# Patient Record
Sex: Female | Born: 1985 | Hispanic: Yes | Marital: Married | Smoking: Former smoker
Health system: Southern US, Community
[De-identification: ages and names within clinical notes are randomized; demographics above are authoritative.]

## PROBLEM LIST (undated history)

## (undated) DIAGNOSIS — E559 Vitamin D deficiency, unspecified: Secondary | ICD-10-CM

## (undated) DIAGNOSIS — N83209 Unspecified ovarian cyst, unspecified side: Secondary | ICD-10-CM

## (undated) DIAGNOSIS — E039 Hypothyroidism, unspecified: Secondary | ICD-10-CM

## (undated) HISTORY — DX: Hypothyroidism, unspecified: E03.9

## (undated) HISTORY — DX: Vitamin D deficiency, unspecified: E55.9

## (undated) HISTORY — PX: TUBAL LIGATION: SHX77

---
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
## 2011-05-06 ENCOUNTER — Emergency Department (INDEPENDENT_AMBULATORY_CARE_PROVIDER_SITE_OTHER): Payer: Medicaid Other

## 2011-05-06 ENCOUNTER — Emergency Department: Payer: Self-pay

## 2011-05-06 ENCOUNTER — Encounter: Payer: Self-pay | Admitting: *Deleted

## 2011-05-06 ENCOUNTER — Emergency Department (HOSPITAL_BASED_OUTPATIENT_CLINIC_OR_DEPARTMENT_OTHER): Payer: Medicaid Other

## 2011-05-06 ENCOUNTER — Emergency Department (HOSPITAL_BASED_OUTPATIENT_CLINIC_OR_DEPARTMENT_OTHER)
Admission: EM | Admit: 2011-05-06 | Discharge: 2011-05-06 | Disposition: A | Discharge status: Home / Self Care | Payer: Medicaid Other | Attending: Emergency Medicine | Admitting: Emergency Medicine

## 2011-05-06 DIAGNOSIS — Z3490 Encounter for supervision of normal pregnancy, unspecified, unspecified trimester: Secondary | ICD-10-CM

## 2011-05-06 DIAGNOSIS — O26859 Spotting complicating pregnancy, unspecified trimester: Secondary | ICD-10-CM

## 2011-05-06 DIAGNOSIS — O0990 Supervision of high risk pregnancy, unspecified, unspecified trimester: Secondary | ICD-10-CM

## 2011-05-06 DIAGNOSIS — N949 Unspecified condition associated with female genital organs and menstrual cycle: Secondary | ICD-10-CM

## 2011-05-06 DIAGNOSIS — O4690 Antepartum hemorrhage, unspecified, unspecified trimester: Secondary | ICD-10-CM

## 2011-05-06 HISTORY — DX: Unspecified ovarian cyst, unspecified side: N83.209

## 2011-05-06 LAB — URINALYSIS, ROUTINE W REFLEX MICROSCOPIC
Glucose, UA: NEGATIVE mg/dL
Leukocytes, UA: NEGATIVE
Protein, ur: NEGATIVE mg/dL
pH: 7.5 (ref 5.0–8.0)

## 2011-05-06 LAB — CBC
HCT: 35.6 % — ABNORMAL LOW (ref 36.0–46.0)
Hemoglobin: 12.3 g/dL (ref 12.0–15.0)
MCH: 31.2 pg (ref 26.0–34.0)
MCHC: 34.6 g/dL (ref 30.0–36.0)
RBC: 3.94 MIL/uL (ref 3.87–5.11)

## 2011-05-06 LAB — DIFFERENTIAL
Basophils Relative: 0 % (ref 0–1)
Lymphs Abs: 2.7 10*3/uL (ref 0.7–4.0)
Monocytes Absolute: 0.7 10*3/uL (ref 0.1–1.0)
Monocytes Relative: 6 % (ref 3–12)
Neutro Abs: 8 10*3/uL — ABNORMAL HIGH (ref 1.7–7.7)
Neutrophils Relative %: 68 % (ref 43–77)

## 2011-05-06 LAB — URINE MICROSCOPIC-ADD ON

## 2011-05-06 LAB — WET PREP, GENITAL
Trich, Wet Prep: NONE SEEN
Yeast Wet Prep HPF POC: NONE SEEN

## 2011-05-06 LAB — PREGNANCY, URINE: Preg Test, Ur: POSITIVE

## 2011-05-06 NOTE — ED Notes (Signed)
Pt. Had blood type done.  Call from [place] at [facility] and gave pt. Blood type as A+

## 2011-05-06 NOTE — ED Provider Notes (Signed)
History     CSN: [id number] Arrival date & time: 05/06/2011 10:09 AM   First MD Initiated Contact with Patient 05/06/11 1039      Chief Complaint  Patient presents with  . Vaginal Bleeding    (Consider location/radiation/quality/duration/timing/severity/associated sxs/prior treatment) HPI Patient is a 25 yo [id number] at 12 weeks by LMP of 01/30/2011.  She presents with complaint of vaginal spotting for 1 day with passage of small clots.  She has not passed any POC.  She denies nausea, vomiting, urinary symptoms, or other vaginal discharge.  Patient does complain of mild lower abdominal cramping.  She has noted dyspareunia and states that bleeding began following vaginal discharged.  She has seen blood on the toilet paper but not on her underwear.  She had an episode of spotting last week but thought little of it.  She has an appointment to see her OB on 12/15 but has not yet had an ultrasound.  She denies history of STD's, PID, or abnormal pap smears. Past Medical History  Diagnosis Date  . Ovarian cyst     Past Surgical History  Procedure Date  . Ovarian cyst removal 2002    No family history on file.  History  Substance Use Topics  . Smoking status: Former Games developer  . Smokeless tobacco: Not on file  . Alcohol Use: No    OB History    Grav Para Term Preterm Abortions TAB SAB Ect Mult Living   5 3              Review of Systems  Constitutional: Negative.   HENT: Negative.   Eyes: Negative.   Respiratory: Negative.   Cardiovascular: Negative.   Gastrointestinal: Positive for abdominal pain.  Genitourinary: Positive for vaginal bleeding and dyspareunia.  Musculoskeletal: Negative.   Skin: Negative.   Neurological: Negative.   Hematological: Negative.   Psychiatric/Behavioral: Negative.   All other systems reviewed and are negative.    Allergies  Penicillins  Home Medications   Current Outpatient Rx  Name Route Sig Dispense Refill  . PRENATAL RX 60-1 MG PO TABS  Oral Take 1 tablet by mouth daily.        BP 121/74  Pulse 81  Ht 5\' 4"  (1.626 m)  Wt 242 lb (109.77 kg)  BMI 41.54 kg/m2  SpO2 100%  LMP 01/30/2011  Physical Exam  Nursing note and vitals reviewed. Constitutional: She is oriented to person, place, and time. She appears well-developed and well-nourished. No distress.  HENT:  Head: Normocephalic and atraumatic.  Eyes: Conjunctivae and EOM are normal. Pupils are equal, round, and reactive to light.  Neck: Normal range of motion.  Cardiovascular: Normal rate, regular rhythm, normal heart sounds and intact distal pulses.  Exam reveals no gallop and no friction rub.   No murmur heard. Pulmonary/Chest: Effort normal and breath sounds normal. No respiratory distress. She has no wheezes. She has no rales.  Abdominal: Soft. Bowel sounds are normal. She exhibits no distension. There is no tenderness. There is no rebound and no guarding.  Genitourinary: Pelvic exam was performed with patient supine. Vaginal discharge found.       Brownish mucoid discharge minimal in quantity,  Cervix: external os open internal os closed, discomfort throughout exam but no cervical or adnexal tenderness  Musculoskeletal: Normal range of motion. She exhibits no edema.  Neurological: She is alert and oriented to person, place, and time. No cranial nerve deficit. She exhibits normal muscle tone. Coordination normal.  Skin: Skin is warm  and dry. No rash noted.  Psychiatric: She has a normal mood and affect.    ED Course  Procedures (including critical care time)  Labs Reviewed  CBC - Abnormal; Notable for the following:    WBC 11.7 (*)    HCT 35.6 (*)    All other components within normal limits  DIFFERENTIAL - Abnormal; Notable for the following:    Neutro Abs 8.0 (*)    All other components within normal limits  HCG, QUANTITATIVE, PREGNANCY - Abnormal; Notable for the following:    hCG, Beta Chain, Quant, S 34316 (*)    All other components within normal  limits  URINALYSIS, ROUTINE W REFLEX MICROSCOPIC - Abnormal; Notable for the following:    Hgb urine dipstick MODERATE (*)    All other components within normal limits  WET PREP, GENITAL - Abnormal; Notable for the following:    WBC, Wet Prep HPF POC MODERATE (*)    All other components within normal limits  PREGNANCY, URINE  ABO/RH  URINE MICROSCOPIC-ADD ON  GC/CHLAMYDIA PROBE AMP, GENITAL   US Ob Comp Less 14 Wks  05/06/2011  *RADIOLOGY REPORT*  Clinical Data: Pelvic pain with vaginal spotting.  Post left ovarian cystectomy.  OBSTETRIC <14 WK ULTRASOUND  Technique:  Transabdominal ultrasound was performed for evaluation of the gestation as well as the maternal uterus and adnexal regions.  Comparison:  None.  Intrauterine gestational sac: Visualized/normal in shape. Yolk sac: Not visualized Embryo: Visualized Cardiac Activity: Visualized Heart Rate: 147 bpm  MSD:  mm  w  d CRL:  75.9 mm mm  13w  5d           [place] EDC: 11/06/2011  Maternal uterus/Adnexae: The left ovary has a normal appearance measuring 2.9 x 2.9 x 1.6 cm.  The right ovary was not seen with confidence. No pelvic fluid is seen.  IMPRESSION: Single living intrauterine pregnancy demonstrating an estimated gestational age by crown-rump length of 13 weeks 5 days.  This corresponds well with expected estimated gestational age by LMP of 13 weeks 5 days with corresponding EDC of 11/06/2011.  Normal ovaries.  Original Report Authenticated By: Bertha Stakes, M.D.     1. Vaginal bleeding in pregnancy   2. Normal IUP (intrauterine pregnancy) on prenatal ultrasound       MDM  Patient had history and physical concerning for possible fetal loss.  The patient had samples collected.  UA was unremarkable and U preg was positive.  Patient had unremarkable wet prep and UA.  WBCs were present but exam was not consistent with cervicitis.  Patient had bHCG of 34,000.  Os was closed and TVUS showed IUP with normal [redacted] week gestation fetus.   Patient had follow-up already established with her OB-GYN.  She was given bleeding precautions and reasons to return.  Rh type was + and rhoGham was not indicated.  The patient was discharged in good condition with threatened Ab.  No report was called to Ob as patient is only [redacted] weeks gestation and she has not yet established care with her OB.  She was discharged in good condition.        Cyndra Numbers, MD 05/06/11 2109

## 2011-05-06 NOTE — ED Notes (Signed)
Patient states she spotting vaginal bleeding last week and it resolved on its own.  Last after voiding she wiped and had mucus / blood on the tissue.  C/o lower abdominal pain for the last two days.  Gravada 4 para 3.

## 2011-05-07 LAB — GC/CHLAMYDIA PROBE AMP, GENITAL
Chlamydia, DNA Probe: NEGATIVE
GC Probe Amp, Genital: NEGATIVE

## 2013-05-09 ENCOUNTER — Emergency Department (HOSPITAL_BASED_OUTPATIENT_CLINIC_OR_DEPARTMENT_OTHER): Payer: Managed Care, Other (non HMO)

## 2013-05-09 ENCOUNTER — Encounter (HOSPITAL_BASED_OUTPATIENT_CLINIC_OR_DEPARTMENT_OTHER): Payer: Self-pay | Admitting: Emergency Medicine

## 2013-05-09 ENCOUNTER — Emergency Department (HOSPITAL_BASED_OUTPATIENT_CLINIC_OR_DEPARTMENT_OTHER)
Admission: EM | Admit: 2013-05-09 | Discharge: 2013-05-09 | Disposition: A | Discharge status: Home / Self Care | Payer: Managed Care, Other (non HMO) | Attending: Emergency Medicine | Admitting: Emergency Medicine

## 2013-05-09 DIAGNOSIS — Z3202 Encounter for pregnancy test, result negative: Secondary | ICD-10-CM | POA: Insufficient documentation

## 2013-05-09 DIAGNOSIS — Z880 Allergy status to penicillin: Secondary | ICD-10-CM | POA: Insufficient documentation

## 2013-05-09 DIAGNOSIS — R1031 Right lower quadrant pain: Secondary | ICD-10-CM | POA: Insufficient documentation

## 2013-05-09 DIAGNOSIS — Z8742 Personal history of other diseases of the female genital tract: Secondary | ICD-10-CM | POA: Insufficient documentation

## 2013-05-09 DIAGNOSIS — R110 Nausea: Secondary | ICD-10-CM | POA: Insufficient documentation

## 2013-05-09 DIAGNOSIS — Z87891 Personal history of nicotine dependence: Secondary | ICD-10-CM | POA: Insufficient documentation

## 2013-05-09 DIAGNOSIS — R109 Unspecified abdominal pain: Secondary | ICD-10-CM

## 2013-05-09 LAB — CBC WITH DIFFERENTIAL/PLATELET
Eosinophils Absolute: 0.2 10*3/uL (ref 0.0–0.7)
HCT: 36.8 % (ref 36.0–46.0)
Hemoglobin: 12.4 g/dL (ref 12.0–15.0)
Lymphs Abs: 3 10*3/uL (ref 0.7–4.0)
MCH: 31.2 pg (ref 26.0–34.0)
Monocytes Absolute: 0.8 10*3/uL (ref 0.1–1.0)
Monocytes Relative: 8 % (ref 3–12)
Neutrophils Relative %: 61 % (ref 43–77)
RBC: 3.98 MIL/uL (ref 3.87–5.11)

## 2013-05-09 LAB — URINALYSIS, ROUTINE W REFLEX MICROSCOPIC
Leukocytes, UA: NEGATIVE
Nitrite: NEGATIVE
Protein, ur: NEGATIVE mg/dL
Specific Gravity, Urine: 1.011 (ref 1.005–1.030)
Urobilinogen, UA: 0.2 mg/dL (ref 0.0–1.0)

## 2013-05-09 LAB — COMPREHENSIVE METABOLIC PANEL
ALT: 30 U/L (ref 0–35)
AST: 19 U/L (ref 0–37)
Albumin: 4 g/dL (ref 3.5–5.2)
Alkaline Phosphatase: 80 U/L (ref 39–117)
BUN: 10 mg/dL (ref 6–23)
CO2: 24 mEq/L (ref 19–32)
Calcium: 9.1 mg/dL (ref 8.4–10.5)
Chloride: 101 mEq/L (ref 96–112)
Creatinine, Ser: 0.6 mg/dL (ref 0.50–1.10)
GFR calc Af Amer: >90 mL/min (ref >90)
GFR calc non Af Amer: >90 mL/min (ref >90)
Glucose, Bld: 90 mg/dL (ref 70–99)
Potassium: 3.9 mEq/L (ref 3.5–5.1)
Sodium: 137 mEq/L (ref 135–145)
Total Bilirubin: 0.2 mg/dL — ABNORMAL LOW (ref 0.3–1.2)
Total Protein: 8.6 g/dL — ABNORMAL HIGH (ref 6.0–8.3)

## 2013-05-09 LAB — WET PREP, GENITAL
Trich, Wet Prep: NONE SEEN
Yeast Wet Prep HPF POC: NONE SEEN

## 2013-05-09 LAB — URINE MICROSCOPIC-ADD ON

## 2013-05-09 LAB — PREGNANCY, URINE: Preg Test, Ur: NEGATIVE

## 2013-05-09 MED ORDER — IOHEXOL 300 MG/ML  SOLN
50.0000 mL | Freq: Once | INTRAMUSCULAR | Status: AC | PRN
  Administered 2013-05-09: 50 mL via ORAL

## 2013-05-09 MED ORDER — HYDROCODONE-ACETAMINOPHEN 5-325 MG PO TABS: 2.0000 | ORAL_TABLET | ORAL | Status: DC | PRN

## 2013-05-09 MED ORDER — SODIUM CHLORIDE 0.9 % IV BOLUS (SEPSIS)
1000.0000 mL | Freq: Once | INTRAVENOUS | Status: AC
  Administered 2013-05-09: 1000 mL via INTRAVENOUS

## 2013-05-09 MED ORDER — DIPHENHYDRAMINE HCL 50 MG/ML IJ SOLN
25.0000 mg | Freq: Once | INTRAMUSCULAR | Status: AC
  Administered 2013-05-09: 25 mg via INTRAVENOUS

## 2013-05-09 MED ORDER — HYDROMORPHONE HCL PF 1 MG/ML IJ SOLN
1.0000 mg | Freq: Once | INTRAMUSCULAR | Status: AC
  Administered 2013-05-09: 1 mg via INTRAVENOUS
  Filled 2013-05-09: qty 1

## 2013-05-09 MED ORDER — METHYLPREDNISOLONE SODIUM SUCC 125 MG IJ SOLR
125.0000 mg | Freq: Once | INTRAMUSCULAR | Status: AC
  Administered 2013-05-09: 125 mg via INTRAVENOUS
  Filled 2013-05-09: qty 2

## 2013-05-09 MED ORDER — DIPHENHYDRAMINE HCL 50 MG/ML IJ SOLN
INTRAMUSCULAR | Status: AC
  Administered 2013-05-09: 25 mg via INTRAVENOUS
  Filled 2013-05-09: qty 1

## 2013-05-09 MED ORDER — ONDANSETRON HCL 4 MG/2ML IJ SOLN
4.0000 mg | Freq: Once | INTRAMUSCULAR | Status: AC
  Administered 2013-05-09: 4 mg via INTRAVENOUS

## 2013-05-09 MED ORDER — ONDANSETRON HCL 4 MG/2ML IJ SOLN
4.0000 mg | Freq: Once | INTRAMUSCULAR | Status: DC
  Filled 2013-05-09: qty 2

## 2013-05-09 MED ORDER — IOHEXOL 300 MG/ML  SOLN
100.0000 mL | Freq: Once | INTRAMUSCULAR | Status: AC | PRN
  Administered 2013-05-09: 100 mL via INTRAVENOUS

## 2013-05-09 MED ORDER — HYDROMORPHONE HCL PF 1 MG/ML IJ SOLN
1.0000 mg | Freq: Once | INTRAMUSCULAR | Status: AC
  Administered 2013-05-09: 0.5 mg via INTRAVENOUS
  Filled 2013-05-09 (×2): qty 1

## 2013-05-09 MED ORDER — METRONIDAZOLE 500 MG PO TABS: 500.0000 mg | ORAL_TABLET | Freq: Two times a day (BID) | ORAL | Status: DC

## 2013-05-09 MED ORDER — FAMOTIDINE IN NACL 20-0.9 MG/50ML-% IV SOLN
20.0000 mg | Freq: Once | INTRAVENOUS | Status: AC
  Administered 2013-05-09: 20 mg via INTRAVENOUS
  Filled 2013-05-09: qty 50

## 2013-05-09 NOTE — ED Provider Notes (Signed)
CSN: [id number]     Arrival date & time 05/09/13  1519 History   First MD Initiated Contact with Patient 05/09/13 1548     Chief Complaint  Patient presents with  . Abdominal Pain   (Consider location/radiation/quality/duration/timing/severity/associated sxs/prior Treatment) Patient is a 27 y.o. female presenting with abdominal pain. The history is provided by the patient.  Abdominal Pain Pain location:  RLQ Pain quality: aching, sharp and stabbing   Pain radiates to:  RLQ Pain severity:  Moderate Onset quality:  Sudden Duration:  4 hours Timing:  Constant Progression:  Unchanged Chronicity:  New Context: alcohol use and sick contacts   Relieved by:  Nothing Worsened by:  Nothing tried Ineffective treatments:  None tried Associated symptoms: nausea   Associated symptoms: no vomiting   Risk factors: not pregnant and no recent hospitalization     Past Medical History  Diagnosis Date  . Ovarian cyst    Past Surgical History  Procedure Laterality Date  . Ovarian cyst removal  2002  . Tubal ligation     History reviewed. No pertinent family history. History  Substance Use Topics  . Smoking status: Former Games developer  . Smokeless tobacco: Not on file  . Alcohol Use: No   OB History   Grav Para Term Preterm Abortions TAB SAB Ect Mult Living   5 3             Review of Systems  Gastrointestinal: Positive for nausea and abdominal pain. Negative for vomiting.  All other systems reviewed and are negative.    Allergies  Penicillins  Home Medications  No current outpatient prescriptions on file. BP 109/72  Pulse 76  Temp(Src) 97.9 F (36.6 C) (Oral)  Resp 18  Ht 5\' 3"  (1.6 m)  Wt 229 lb (103.874 kg)  BMI 40.58 kg/m2  SpO2 100%  LMP 03/31/2013  Breastfeeding? Unknown Physical Exam  Nursing note and vitals reviewed. Constitutional: She is oriented to person, place, and time. She appears well-developed and well-nourished.  HENT:  Head: Normocephalic and  atraumatic.  Eyes: Pupils are equal, round, and reactive to light.  Neck: Normal range of motion.  Cardiovascular: Normal rate and normal heart sounds.   Pulmonary/Chest: Effort normal and breath sounds normal.  Abdominal: Soft. There is tenderness.  Genitourinary: Uterus normal.  Small amount of blood  Musculoskeletal: Normal range of motion.  Neurological: She is alert and oriented to person, place, and time.  Skin: Skin is warm.  Psychiatric: She has a normal mood and affect.    ED Course  Procedures (including critical care time) Labs Review Labs Reviewed  WET PREP, GENITAL - Abnormal; Notable for the following:    Clue Cells Wet Prep HPF POC FEW (*)    WBC, Wet Prep HPF POC FEW (*)    All other components within normal limits  URINALYSIS, ROUTINE W REFLEX MICROSCOPIC - Abnormal; Notable for the following:    Hgb urine dipstick MODERATE (*)    All other components within normal limits  URINE MICROSCOPIC-ADD ON - Abnormal; Notable for the following:    Squamous Epithelial / LPF FEW (*)    Bacteria, UA FEW (*)    All other components within normal limits  COMPREHENSIVE METABOLIC PANEL - Abnormal; Notable for the following:    Total Protein 8.6 (*)    Total Bilirubin 0.2 (*)    All other components within normal limits  GC/CHLAMYDIA PROBE AMP  PREGNANCY, URINE  CBC WITH DIFFERENTIAL   Imaging Review US Transvaginal  Non-ob  05/09/2013   CLINICAL DATA:  Right lower quadrant abdominal pain. Irregular menstrual periods.  EXAM: TRANSABDOMINAL AND TRANSVAGINAL ULTRASOUND OF PELVIS  TECHNIQUE: Both transabdominal and transvaginal ultrasound examinations of the pelvis were performed. Transabdominal technique was performed for global imaging of the pelvis including uterus, ovaries, adnexal regions, and pelvic cul-de-sac. It was necessary to proceed with endovaginal exam following the transabdominal exam to visualize the uterus, ovaries, and endometrium. .  COMPARISON:  05/09/2013   FINDINGS: Uterus  Measurements: 8.7 x 4.5 x 6.2 cm. Heterogeneous echotexture without focal mass.  Endometrium  Thickness: 12 mm.  No focal abnormality visualized.  Right ovary  Measurements:  Not visualized.  Left ovary  Measurements: 3.8 x 2.0 x 3.7 cm. Normal appearance/no adnexal mass.  Other findings  No free fluid.  IMPRESSION: 1. Mildly heterogeneous myometrium, query adenomyosis. 2. Nonvisualization of the right ovary.   Electronically Signed   By: Herbie Baltimore M.D.   On: 05/09/2013 20:15   US Pelvis Complete  05/09/2013   CLINICAL DATA:  Right lower quadrant abdominal pain. Irregular menstrual periods.  EXAM: TRANSABDOMINAL AND TRANSVAGINAL ULTRASOUND OF PELVIS  TECHNIQUE: Both transabdominal and transvaginal ultrasound examinations of the pelvis were performed. Transabdominal technique was performed for global imaging of the pelvis including uterus, ovaries, adnexal regions, and pelvic cul-de-sac. It was necessary to proceed with endovaginal exam following the transabdominal exam to visualize the uterus, ovaries, and endometrium. .  COMPARISON:  05/09/2013  FINDINGS: Uterus  Measurements: 8.7 x 4.5 x 6.2 cm. Heterogeneous echotexture without focal mass.  Endometrium  Thickness: 12 mm.  No focal abnormality visualized.  Right ovary  Measurements:  Not visualized.  Left ovary  Measurements: 3.8 x 2.0 x 3.7 cm. Normal appearance/no adnexal mass.  Other findings  No free fluid.  IMPRESSION: 1. Mildly heterogeneous myometrium, query adenomyosis. 2. Nonvisualization of the right ovary.   Electronically Signed   By: Herbie Baltimore M.D.   On: 05/09/2013 20:15   Ct Abdomen Pelvis W Contrast  05/09/2013   CLINICAL DATA:  Lower abdominal pain  EXAM: CT ABDOMEN AND PELVIS WITH CONTRAST  TECHNIQUE: Multidetector CT imaging of the abdomen and pelvis was performed using the standard protocol following bolus administration of intravenous contrast. Oral contrast was also administered.  CONTRAST:   OMNIPAQUE IOHEXOL 300 MG/ML  SOLN  COMPARISON:  None.  FINDINGS: Lung bases are clear.  The liver is prominent, measuring 17.7 cm in length. No focal liver lesions are identified. There is no biliary duct dilatation. The gallbladder wall does not appear appreciably thickened.  Spleen, pancreas, and adrenals appear normal. Kidneys bilaterally show no mass or hydronephrosis on either side.  In the pelvis, there is no mass or fluid collection. Appendix region appears normal; there is no right lower quadrant inflammatory type change.  There is no bowel obstruction.  No free air or portal venous air.  There is no ascites, adenopathy, or abscess in the abdomen or pelvis. Aorta is nonaneurysmal. There are no blastic or lytic bone lesions.  IMPRESSION: No mesenteric inflammation or abscess. Appendix region appears normal. No bowel obstruction. No hydronephrosis.  Liver is prominent but otherwise appears normal.   Electronically Signed   By: Bretta Bang M.D.   On: 05/09/2013 17:31    EKG Interpretation   None       MDM Pt has pain right lower quadrant.   Pt reports some relief with dilaudid.   Ct normal.   Pt felt short of breath after  IV contrast.  Pt reports throat felt tight.   Pt given solumedrol, benadryl and pepcid.   Pt reports symptoms resolved.   Ultrasound is normal,  No ovarian cyst.   Wet prep does show  Bv.   Pt given rx for hydrocodone and flagyl.  Pt advised to recheck with her MD.    1. Abdominal pain      Elson Areas, [place] 05/09/13 2049

## 2013-05-09 NOTE — ED Notes (Signed)
Pt returns from ct scan, reports she feels as if her throat is "scratchy and full" karen sofia, pa alerted to bedside.

## 2013-05-09 NOTE — ED Notes (Signed)
Pt resting quietly, reports decreased "scratchy and fullness" in her throat.

## 2013-05-09 NOTE — ED Notes (Signed)
Pt c/o right lower abd pain x 4 hrs  

## 2013-05-09 NOTE — ED Provider Notes (Signed)
  Medical screening examination/treatment/procedure(s) were performed by non-physician practitioner and as supervising physician I was immediately available for consultation/collaboration.  EKG Interpretation   None          Persephone Schriever, MD 05/09/13 2203 

## 2013-05-09 NOTE — ED Notes (Signed)
Patient transported to Ultrasound 

## 2013-11-27 ENCOUNTER — Encounter (HOSPITAL_BASED_OUTPATIENT_CLINIC_OR_DEPARTMENT_OTHER): Payer: Self-pay | Admitting: Emergency Medicine

## 2013-11-27 ENCOUNTER — Emergency Department (HOSPITAL_BASED_OUTPATIENT_CLINIC_OR_DEPARTMENT_OTHER)
Admission: EM | Admit: 2013-11-27 | Discharge: 2013-11-27 | Disposition: A | Discharge status: Home / Self Care | Payer: Managed Care, Other (non HMO) | Attending: Emergency Medicine | Admitting: Emergency Medicine

## 2013-11-27 ENCOUNTER — Emergency Department (HOSPITAL_BASED_OUTPATIENT_CLINIC_OR_DEPARTMENT_OTHER): Payer: Managed Care, Other (non HMO)

## 2013-11-27 DIAGNOSIS — Z79899 Other long term (current) drug therapy: Secondary | ICD-10-CM | POA: Insufficient documentation

## 2013-11-27 DIAGNOSIS — Z8742 Personal history of other diseases of the female genital tract: Secondary | ICD-10-CM | POA: Insufficient documentation

## 2013-11-27 DIAGNOSIS — R350 Frequency of micturition: Secondary | ICD-10-CM | POA: Insufficient documentation

## 2013-11-27 DIAGNOSIS — R5383 Other fatigue: Secondary | ICD-10-CM

## 2013-11-27 DIAGNOSIS — Z880 Allergy status to penicillin: Secondary | ICD-10-CM | POA: Insufficient documentation

## 2013-11-27 DIAGNOSIS — R1031 Right lower quadrant pain: Secondary | ICD-10-CM | POA: Insufficient documentation

## 2013-11-27 DIAGNOSIS — Z87891 Personal history of nicotine dependence: Secondary | ICD-10-CM | POA: Insufficient documentation

## 2013-11-27 DIAGNOSIS — R110 Nausea: Secondary | ICD-10-CM | POA: Insufficient documentation

## 2013-11-27 DIAGNOSIS — R5381 Other malaise: Secondary | ICD-10-CM | POA: Insufficient documentation

## 2013-11-27 DIAGNOSIS — Z9851 Tubal ligation status: Secondary | ICD-10-CM | POA: Insufficient documentation

## 2013-11-27 DIAGNOSIS — Z3202 Encounter for pregnancy test, result negative: Secondary | ICD-10-CM | POA: Insufficient documentation

## 2013-11-27 DIAGNOSIS — R509 Fever, unspecified: Secondary | ICD-10-CM | POA: Insufficient documentation

## 2013-11-27 LAB — COMPREHENSIVE METABOLIC PANEL
ALK PHOS: 98 U/L (ref 39–117)
ALT: 73 U/L — ABNORMAL HIGH (ref 0–35)
AST: 58 U/L — ABNORMAL HIGH (ref 0–37)
Albumin: 4.2 g/dL (ref 3.5–5.2)
BUN: 15 mg/dL (ref 6–23)
CO2: 24 mEq/L (ref 19–32)
Calcium: 9.4 mg/dL (ref 8.4–10.5)
Chloride: 101 mEq/L (ref 96–112)
Creatinine, Ser: 0.7 mg/dL (ref 0.50–1.10)
GLUCOSE: 91 mg/dL (ref 70–99)
POTASSIUM: 3.9 meq/L (ref 3.7–5.3)
Sodium: 139 mEq/L (ref 137–147)
Total Bilirubin: 0.3 mg/dL (ref 0.3–1.2)
Total Protein: 9.2 g/dL — ABNORMAL HIGH (ref 6.0–8.3)

## 2013-11-27 LAB — CBC WITH DIFFERENTIAL/PLATELET
Basophils Absolute: 0 10*3/uL (ref 0.0–0.1)
Basophils Relative: 0 % (ref 0–1)
Eosinophils Absolute: 0.3 10*3/uL (ref 0.0–0.7)
Eosinophils Relative: 3 % (ref 0–5)
HCT: 39.5 % (ref 36.0–46.0)
HEMOGLOBIN: 13.4 g/dL (ref 12.0–15.0)
LYMPHS ABS: 3.5 10*3/uL (ref 0.7–4.0)
Lymphocytes Relative: 35 % (ref 12–46)
MCH: 30.9 pg (ref 26.0–34.0)
MCHC: 33.9 g/dL (ref 30.0–36.0)
MCV: 91 fL (ref 78.0–100.0)
Monocytes Absolute: 0.9 10*3/uL (ref 0.1–1.0)
Monocytes Relative: 9 % (ref 3–12)
NEUTROS ABS: 5.3 10*3/uL (ref 1.7–7.7)
Neutrophils Relative %: 53 % (ref 43–77)
Platelets: 284 10*3/uL (ref 150–400)
RBC: 4.34 MIL/uL (ref 3.87–5.11)
RDW: 13.4 % (ref 11.5–15.5)
WBC: 9.9 10*3/uL (ref 4.0–10.5)

## 2013-11-27 LAB — URINALYSIS, ROUTINE W REFLEX MICROSCOPIC
BILIRUBIN URINE: NEGATIVE
Glucose, UA: NEGATIVE mg/dL
KETONES UR: NEGATIVE mg/dL
Leukocytes, UA: NEGATIVE
NITRITE: NEGATIVE
PH: 5.5 (ref 5.0–8.0)
PROTEIN: NEGATIVE mg/dL
Specific Gravity, Urine: 1.017 (ref 1.005–1.030)
Urobilinogen, UA: 0.2 mg/dL (ref 0.0–1.0)

## 2013-11-27 LAB — PREGNANCY, URINE: Preg Test, Ur: NEGATIVE

## 2013-11-27 LAB — LIPASE, BLOOD: Lipase: 56 U/L (ref 11–59)

## 2013-11-27 LAB — URINE MICROSCOPIC-ADD ON

## 2013-11-27 MED ORDER — IOHEXOL 300 MG/ML  SOLN
50.0000 mL | Freq: Once | INTRAMUSCULAR | Status: AC | PRN
  Administered 2013-11-27: 50 mL via ORAL

## 2013-11-27 MED ORDER — HYDROCODONE-ACETAMINOPHEN 5-325 MG PO TABS: 2.0000 | ORAL_TABLET | ORAL | Status: DC | PRN

## 2013-11-27 MED ORDER — IOHEXOL 300 MG/ML  SOLN
100.0000 mL | Freq: Once | INTRAMUSCULAR | Status: AC | PRN
  Administered 2013-11-27: 100 mL via INTRAVENOUS

## 2013-11-27 MED ORDER — ONDANSETRON HCL 4 MG/2ML IJ SOLN
4.0000 mg | Freq: Once | INTRAMUSCULAR | Status: AC
  Administered 2013-11-27: 4 mg via INTRAVENOUS

## 2013-11-27 MED ORDER — IBUPROFEN 800 MG PO TABS: 800.0000 mg | ORAL_TABLET | Freq: Three times a day (TID) | ORAL | Status: DC

## 2013-11-27 MED ORDER — SODIUM CHLORIDE 0.9 % IV BOLUS (SEPSIS)
1000.0000 mL | Freq: Once | INTRAVENOUS | Status: AC
  Administered 2013-11-27: 1000 mL via INTRAVENOUS

## 2013-11-27 MED ORDER — ONDANSETRON HCL 4 MG/2ML IJ SOLN
4.0000 mg | Freq: Once | INTRAMUSCULAR | Status: DC
  Filled 2013-11-27: qty 2

## 2013-11-27 NOTE — ED Provider Notes (Signed)
CSN: [id number]     Arrival date & time 11/27/13  1910 History   First MD Initiated Contact with Patient 11/27/13 2057     This chart was scribed for Breanna English, * by Arlan OrganAshley Leger, ED Scribe. This patient was seen in room MH08/MH08 and the patient's care was started 9:15 PM.   Chief Complaint  Patient presents with  . Abdominal Pain   The history is provided by the patient. No language interpreter was used.    HPI Comments: Breanna English is a 28 y.o. female who presents to the Emergency Department complaining of constant, moderate R sided abdominal pain that radiates to the R flank x 2 days noted directly after exercise. She describes this pain as sharp and pressure. She also reports fatigue, fever, urinary frequency, and nausea. This pain is exacerbated with movement. No alleviating factors at this time. She has tried OTC Ibuprofen with mild temporary improvement. She denies any vomiting or diarrhea. No vaginal discharge. Pt has no pertinent past medical history. No other concerns this visit.  Past Medical History  Diagnosis Date  . Ovarian cyst    Past Surgical History  Procedure Laterality Date  . Ovarian cyst removal  2002  . Tubal ligation     No family history on file. History  Substance Use Topics  . Smoking status: Former Games developermoker  . Smokeless tobacco: Not on file  . Alcohol Use: No   OB History   Grav Para Term Preterm Abortions TAB SAB Ect Mult Living   5 3             Review of Systems  Constitutional: Positive for fever and fatigue.  Gastrointestinal: Positive for nausea and abdominal pain. Negative for vomiting and diarrhea.  Genitourinary: Positive for frequency and flank pain (R flank). Negative for vaginal discharge.  All other systems reviewed and are negative.     Allergies  Penicillins  Home Medications   Prior to Admission medications   Medication Sig Start Date End Date Taking? Authorizing Reynalda Canny  HYDROcodone-acetaminophen  (NORCO/VICODIN) 5-325 MG per tablet Take 2 tablets by mouth every 4 (four) hours as needed. 05/09/13   Elson AreasLeslie K Sofia, PA-C  metroNIDAZOLE (FLAGYL) 500 MG tablet Take 1 tablet (500 mg total) by mouth 2 (two) times daily. 05/09/13   Elson AreasLeslie K Sofia, PA-C   Triage Vitals: BP 130/82  Pulse 64  Temp(Src) 99.1 F (37.3 C) (Oral)  Resp 16  Ht 5\' 3"  (1.6 m)  Wt 247 lb (112.038 kg)  BMI 43.76 kg/m2  SpO2 100%   Physical Exam  Constitutional: She is oriented to person, place, and time. She appears well-developed and well-nourished. No distress.  HENT:  Head: Normocephalic and atraumatic.  Right Ear: Hearing normal.  Left Ear: Hearing normal.  Nose: Nose normal.  Mouth/Throat: Oropharynx is clear and moist and mucous membranes are normal.  Eyes: Conjunctivae and EOM are normal. Pupils are equal, round, and reactive to light.  Neck: Normal range of motion. Neck supple.  Cardiovascular: Regular rhythm, S1 normal and S2 normal.  Exam reveals no gallop and no friction rub.   No murmur heard. Pulmonary/Chest: Effort normal and breath sounds normal. No respiratory distress. She exhibits no tenderness.  Abdominal: Soft. Normal appearance and bowel sounds are normal. There is no hepatosplenomegaly. There is tenderness. There is no rebound, no guarding, no tenderness at McBurney's point and negative Murphy's sign. No hernia.  RLQ tenderness  Musculoskeletal: Normal range of motion.  Neurological: She is alert  and oriented to person, place, and time. She has normal strength. No cranial nerve deficit or sensory deficit. Coordination normal. GCS eye subscore is 4. GCS verbal subscore is 5. GCS motor subscore is 6.  Skin: Skin is warm, dry and intact. No rash noted. No cyanosis.  Psychiatric: She has a normal mood and affect. Her speech is normal and behavior is normal. Thought content normal.    ED Course  Procedures (including critical care time)  DIAGNOSTIC STUDIES: Oxygen Saturation is 100% on RA,  Normal by my interpretation.    COORDINATION OF CARE: 9:00 PM-Discussed treatment plan with pt at bedside and pt agreed to plan.     Labs Review Labs Reviewed  URINALYSIS, ROUTINE W REFLEX MICROSCOPIC - Abnormal; Notable for the following:    Hgb urine dipstick TRACE (*)    All other components within normal limits  PREGNANCY, URINE  URINE MICROSCOPIC-ADD ON    Imaging Review No results found.   EKG Interpretation None      MDM   Final diagnoses:  None   abdominal pain  Presents to ER for evaluation of abdominal pain. Symptoms began 2 days ago. She has not had any vaginal discharge or pelvic complaints. Pain was in the right side of the abdomen diffusely, including the right lower quadrant. Appendicitis was considered. CT scan was performed, however, and is negative. Lab work was normal. Etiology unclear, will treat with analgesia, followup with primary doctor.  I personally performed the services described in this documentation, which was scribed in my presence. The recorded information has been reviewed and is accurate.    Breanna Creasehristopher J. Pollina, MD 11/27/13 [phone]

## 2013-11-27 NOTE — ED Notes (Addendum)
Abd pain x 2 days-denies n/v/d,urinary s/s or vaginal d/c

## 2013-11-27 NOTE — Discharge Instructions (Signed)
Abdominal Pain, Women °Abdominal (stomach, pelvic, or belly) pain can be caused by many things. It is important to tell your doctor: °· The location of the pain. °· Does it come and go or is it present all the time? °· Are there things that start the pain (eating certain foods, exercise)? °· Are there other symptoms associated with the pain (fever, nausea, vomiting, diarrhea)? °All of this is helpful to know when trying to find the cause of the pain. °CAUSES  °· Stomach: virus or bacteria infection, or ulcer. °· Intestine: appendicitis (inflamed appendix), regional ileitis (Crohn's disease), ulcerative colitis (inflamed colon), irritable bowel syndrome, diverticulitis (inflamed diverticulum of the colon), or cancer of the stomach or intestine. °· Gallbladder disease or stones in the gallbladder. °· Kidney disease, kidney stones, or infection. °· Pancreas infection or cancer. °· Fibromyalgia (pain disorder). °· Diseases of the female organs: °¨ Uterus: fibroid (non-cancerous) tumors or infection. °¨ Fallopian tubes: infection or tubal pregnancy. °¨ Ovary: cysts or tumors. °¨ Pelvic adhesions (scar tissue). °¨ Endometriosis (uterus lining tissue growing in the pelvis and on the pelvic organs). °¨ Pelvic congestion syndrome (female organs filling up with blood just before the menstrual period). °¨ Pain with the menstrual period. °¨ Pain with ovulation (producing an egg). °¨ Pain with an IUD (intrauterine device, birth control) in the uterus. °¨ Cancer of the female organs. °· Functional pain (pain not caused by a disease, may improve without treatment). °· Psychological pain. °· Depression. °DIAGNOSIS  °Your doctor will decide the seriousness of your pain by doing an examination. °· Blood tests. °· X-rays. °· Ultrasound. °· CT scan (computed tomography, special type of X-ray). °· MRI (magnetic resonance imaging). °· Cultures, for infection. °· Barium enema (dye inserted in the large intestine, to better view it with  X-rays). °· Colonoscopy (looking in intestine with a lighted tube). °· Laparoscopy (minor surgery, looking in abdomen with a lighted tube). °· Major abdominal exploratory surgery (looking in abdomen with a large incision). °TREATMENT  °The treatment will depend on the cause of the pain.  °· Many cases can be observed and treated at home. °· Over-the-counter medicines recommended by your caregiver. °· Prescription medicine. °· Antibiotics, for infection. °· Birth control pills, for painful periods or for ovulation pain. °· Hormone treatment, for endometriosis. °· Nerve blocking injections. °· Physical therapy. °· Antidepressants. °· Counseling with a psychologist or psychiatrist. °· Minor or major surgery. °HOME CARE INSTRUCTIONS  °· Do not take laxatives, unless directed by your caregiver. °· Take over-the-counter pain medicine only if ordered by your caregiver. Do not take aspirin because it can cause an upset stomach or bleeding. °· Try a clear liquid diet (broth or water) as ordered by your caregiver. Slowly move to a bland diet, as tolerated, if the pain is related to the stomach or intestine. °· Have a thermometer and take your temperature several times a day, and record it. °· Bed rest and sleep, if it helps the pain. °· Avoid sexual intercourse, if it causes pain. °· Avoid stressful situations. °· Keep your follow-up appointments and tests, as your caregiver orders. °· If the pain does not go away with medicine or surgery, you may try: °¨ Acupuncture. °¨ Relaxation exercises (yoga, meditation). °¨ Group therapy. °¨ Counseling. °SEEK MEDICAL CARE IF:  °· You notice certain foods cause stomach pain. °· Your home care treatment is not helping your pain. °· You need stronger pain medicine. °· You want your IUD removed. °· You feel faint or   lightheaded. °· You develop nausea and vomiting. °· You develop a rash. °· You are having side effects or an allergy to your medicine. °SEEK IMMEDIATE MEDICAL CARE IF:  °· Your  pain does not go away or gets worse. °· You have a fever. °· Your pain is felt only in portions of the abdomen. The right side could possibly be appendicitis. The left lower portion of the abdomen could be colitis or diverticulitis. °· You are passing blood in your stools (bright red or black tarry stools, with or without vomiting). °· You have blood in your urine. °· You develop chills, with or without a fever. °· You pass out. °MAKE SURE YOU:  °· Understand these instructions. °· Will watch your condition. °· Will get help right away if you are not doing well or get worse. °Document Released: 03/14/2007 Document Revised: 08/09/2011 Document Reviewed: 04/03/2009 °[employer]® Patient Information ©2015 [employer]. This information is not intended to replace advice given to you by your health care provider. Make sure you discuss any questions you have with your health care provider. ° °

## 2014-04-01 ENCOUNTER — Encounter (HOSPITAL_BASED_OUTPATIENT_CLINIC_OR_DEPARTMENT_OTHER): Payer: Self-pay | Admitting: Emergency Medicine

## 2014-04-05 ENCOUNTER — Emergency Department (HOSPITAL_BASED_OUTPATIENT_CLINIC_OR_DEPARTMENT_OTHER)
Admission: EM | Admit: 2014-04-05 | Discharge: 2014-04-05 | Disposition: A | Discharge status: Home / Self Care | Payer: Managed Care, Other (non HMO) | Attending: Emergency Medicine | Admitting: Emergency Medicine

## 2014-04-05 ENCOUNTER — Encounter (HOSPITAL_BASED_OUTPATIENT_CLINIC_OR_DEPARTMENT_OTHER): Payer: Self-pay | Admitting: *Deleted

## 2014-04-05 DIAGNOSIS — R21 Rash and other nonspecific skin eruption: Secondary | ICD-10-CM | POA: Diagnosis not present

## 2014-04-05 DIAGNOSIS — Z79899 Other long term (current) drug therapy: Secondary | ICD-10-CM | POA: Insufficient documentation

## 2014-04-05 DIAGNOSIS — Z8742 Personal history of other diseases of the female genital tract: Secondary | ICD-10-CM | POA: Diagnosis not present

## 2014-04-05 DIAGNOSIS — Z87891 Personal history of nicotine dependence: Secondary | ICD-10-CM | POA: Insufficient documentation

## 2014-04-05 DIAGNOSIS — Z792 Long term (current) use of antibiotics: Secondary | ICD-10-CM | POA: Insufficient documentation

## 2014-04-05 DIAGNOSIS — Z791 Long term (current) use of non-steroidal anti-inflammatories (NSAID): Secondary | ICD-10-CM | POA: Insufficient documentation

## 2014-04-05 DIAGNOSIS — Z880 Allergy status to penicillin: Secondary | ICD-10-CM | POA: Insufficient documentation

## 2014-04-05 MED ORDER — PREDNISONE 10 MG PO TABS: 20.0000 mg | ORAL_TABLET | Freq: Every day | ORAL | Status: AC

## 2014-04-05 MED ORDER — PREDNISONE 10 MG PO TABS
60.0000 mg | ORAL_TABLET | Freq: Once | ORAL | Status: AC
  Administered 2014-04-05: 10:00:00 60 mg via ORAL
  Filled 2014-04-05 (×2): qty 1

## 2014-04-05 NOTE — Discharge Instructions (Signed)

## 2014-04-05 NOTE — ED Notes (Signed)
C/o rash on palms of hands and bil forearms x 1 week. No other sx.

## 2014-04-05 NOTE — ED Provider Notes (Signed)
CSN: [id number]     Arrival date & time 04/05/14  [id number] History   First MD Initiated Contact with Patient 04/05/14 [phone]     Chief Complaint  Patient presents with  . Rash     (Consider location/radiation/quality/duration/timing/severity/associated sxs/prior Treatment) Patient is a 28 y.o. female presenting with rash. The history is provided by the patient.  Rash Location:  Shoulder/arm Shoulder/arm rash location:  L forearm, R forearm, L hand and R hand Quality: burning, itchiness and redness   Duration:  1 week Timing:  Constant Progression:  Spreading Chronicity:  New Context: new detergent/soap   Context: not chemical exposure, not eggs, not exposure to similar rash, not plant contact, not pollen, not pregnancy, not sick contacts and not sun exposure   Relieved by:  Topical steroids Worsened by:  Nothing tried Ineffective treatments:  Topical steroids Associated symptoms: no abdominal pain, no diarrhea, no fatigue, no fever, no headaches, no induration, no joint pain, no myalgias, no nausea, no periorbital edema, no sore throat, no throat swelling, no tongue swelling, no URI and not vomiting     Past Medical History  Diagnosis Date  . Ovarian cyst    Past Surgical History  Procedure Laterality Date  . Ovarian cyst removal  2002  . Tubal ligation     No family history on file. History  Substance Use Topics  . Smoking status: Former Games developermoker  . Smokeless tobacco: Not on file  . Alcohol Use: No   OB History    Gravida Para Term Preterm AB TAB SAB Ectopic Multiple Living   5 3             Review of Systems  Constitutional: Negative for fever and fatigue.  HENT: Negative for sore throat.   Gastrointestinal: Negative for nausea, vomiting, abdominal pain and diarrhea.  Musculoskeletal: Negative for myalgias and arthralgias.  Skin: Positive for rash.  Neurological: Negative for headaches.  All other systems reviewed and are negative.     Allergies   Penicillins  Home Medications   Prior to Admission medications   Medication Sig Start Date End Date Taking? Authorizing Provider  beta carotene w/minerals (OCUVITE) tablet Take 1 tablet by mouth daily.   Yes Historical Provider, MD  HYDROcodone-acetaminophen (NORCO/VICODIN) 5-325 MG per tablet Take 2 tablets by mouth every 4 (four) hours as needed. 05/09/13   Elson AreasLeslie K Sofia, PA-C  HYDROcodone-acetaminophen (NORCO/VICODIN) 5-325 MG per tablet Take 2 tablets by mouth every 4 (four) hours as needed for moderate pain. 11/27/13   Gilda Creasehristopher J. Pollina, MD  ibuprofen (ADVIL,MOTRIN) 800 MG tablet Take 1 tablet (800 mg total) by mouth 3 (three) times daily. 11/27/13   Gilda Creasehristopher J. Pollina, MD  metroNIDAZOLE (FLAGYL) 500 MG tablet Take 1 tablet (500 mg total) by mouth 2 (two) times daily. 05/09/13   Lonia SkinnerLeslie K Sofia, PA-C   BP 110/65 mmHg  Pulse 61  Temp(Src) 98.3 F (36.8 C) (Oral)  Resp 18  Ht 5\' 3"  (1.6 m)  Wt 249 lb (112.946 kg)  BMI 44.12 kg/m2  SpO2 100%  LMP 03/26/2014 Physical Exam  Constitutional: She is oriented to person, place, and time. She appears well-developed and well-nourished.  HENT:  Head: Normocephalic and atraumatic.  Eyes: Pupils are equal, round, and reactive to light.  Neck: Normal range of motion.  Musculoskeletal: Normal range of motion.  Neurological: She is alert and oriented to person, place, and time. No cranial nerve deficit. She exhibits normal muscle tone. Coordination normal.  Skin: Skin is warm  and dry. Rash noted. Rash is maculopapular. There is erythema.     Nursing note and vitals reviewed.   ED Course  Procedures (including critical care time) Labs Review Labs Reviewed - No data to display  Imaging Review No results found.   EKG Interpretation None      MDM   Final diagnoses:  Rash      Hilario Quarryanielle S Desirie Minteer, MD 04/08/14 [phone]

## 2014-09-30 ENCOUNTER — Encounter (HOSPITAL_BASED_OUTPATIENT_CLINIC_OR_DEPARTMENT_OTHER): Payer: Self-pay

## 2014-09-30 ENCOUNTER — Emergency Department (HOSPITAL_BASED_OUTPATIENT_CLINIC_OR_DEPARTMENT_OTHER)
Admission: EM | Admit: 2014-09-30 | Discharge: 2014-10-01 | Disposition: A | Discharge status: Home / Self Care | Payer: Managed Care, Other (non HMO) | Attending: Emergency Medicine | Admitting: Emergency Medicine

## 2014-09-30 ENCOUNTER — Emergency Department (HOSPITAL_BASED_OUTPATIENT_CLINIC_OR_DEPARTMENT_OTHER): Payer: Managed Care, Other (non HMO)

## 2014-09-30 DIAGNOSIS — R1032 Left lower quadrant pain: Secondary | ICD-10-CM | POA: Diagnosis present

## 2014-09-30 DIAGNOSIS — Z880 Allergy status to penicillin: Secondary | ICD-10-CM | POA: Insufficient documentation

## 2014-09-30 DIAGNOSIS — Z3202 Encounter for pregnancy test, result negative: Secondary | ICD-10-CM | POA: Diagnosis not present

## 2014-09-30 DIAGNOSIS — R109 Unspecified abdominal pain: Secondary | ICD-10-CM

## 2014-09-30 DIAGNOSIS — B9689 Other specified bacterial agents as the cause of diseases classified elsewhere: Secondary | ICD-10-CM

## 2014-09-30 DIAGNOSIS — N760 Acute vaginitis: Secondary | ICD-10-CM | POA: Diagnosis not present

## 2014-09-30 DIAGNOSIS — N830 Follicular cyst of ovary: Secondary | ICD-10-CM | POA: Diagnosis not present

## 2014-09-30 DIAGNOSIS — N83202 Unspecified ovarian cyst, left side: Secondary | ICD-10-CM

## 2014-09-30 DIAGNOSIS — Z87891 Personal history of nicotine dependence: Secondary | ICD-10-CM | POA: Insufficient documentation

## 2014-09-30 LAB — URINALYSIS, ROUTINE W REFLEX MICROSCOPIC
BILIRUBIN URINE: NEGATIVE
Glucose, UA: NEGATIVE mg/dL
KETONES UR: NEGATIVE mg/dL
Leukocytes, UA: NEGATIVE
NITRITE: NEGATIVE
Protein, ur: NEGATIVE mg/dL
SPECIFIC GRAVITY, URINE: 1.024 (ref 1.005–1.030)
UROBILINOGEN UA: 0.2 mg/dL (ref 0.0–1.0)
pH: 5.5 (ref 5.0–8.0)

## 2014-09-30 LAB — CBC WITH DIFFERENTIAL/PLATELET
Basophils Absolute: 0 10*3/uL (ref 0.0–0.1)
Basophils Relative: 0 % (ref 0–1)
EOS ABS: 0.3 10*3/uL (ref 0.0–0.7)
Eosinophils Relative: 3 % (ref 0–5)
HCT: 36.3 % (ref 36.0–46.0)
Hemoglobin: 12.2 g/dL (ref 12.0–15.0)
LYMPHS ABS: 3.6 10*3/uL (ref 0.7–4.0)
Lymphocytes Relative: 33 % (ref 12–46)
MCH: 30.3 pg (ref 26.0–34.0)
MCHC: 33.6 g/dL (ref 30.0–36.0)
MCV: 90.1 fL (ref 78.0–100.0)
MONOS PCT: 10 % (ref 3–12)
Monocytes Absolute: 1 10*3/uL (ref 0.1–1.0)
Neutro Abs: 5.8 10*3/uL (ref 1.7–7.7)
Neutrophils Relative %: 54 % (ref 43–77)
Platelets: 271 10*3/uL (ref 150–400)
RBC: 4.03 MIL/uL (ref 3.87–5.11)
RDW: 13.8 % (ref 11.5–15.5)
WBC: 10.8 10*3/uL — ABNORMAL HIGH (ref 4.0–10.5)

## 2014-09-30 LAB — WET PREP, GENITAL
TRICH WET PREP: NONE SEEN
Yeast Wet Prep HPF POC: NONE SEEN

## 2014-09-30 LAB — BASIC METABOLIC PANEL
ANION GAP: 3 — AB (ref 5–15)
BUN: 16 mg/dL (ref 6–20)
CHLORIDE: 110 mmol/L (ref 101–111)
CO2: 24 mmol/L (ref 22–32)
Calcium: 8 mg/dL — ABNORMAL LOW (ref 8.9–10.3)
Creatinine, Ser: 0.58 mg/dL (ref 0.44–1.00)
GFR calc non Af Amer: >60 mL/min (ref >60)
GLUCOSE: 78 mg/dL (ref 70–99)
Potassium: 3.6 mmol/L (ref 3.5–5.1)
SODIUM: 137 mmol/L (ref 135–145)

## 2014-09-30 LAB — URINE MICROSCOPIC-ADD ON

## 2014-09-30 LAB — PREGNANCY, URINE: PREG TEST UR: NEGATIVE

## 2014-09-30 MED ORDER — ONDANSETRON HCL 4 MG/2ML IJ SOLN
4.0000 mg | INTRAMUSCULAR | Status: AC
  Administered 2014-09-30: 4 mg via INTRAVENOUS
  Filled 2014-09-30: qty 2

## 2014-09-30 MED ORDER — HYDROMORPHONE HCL 1 MG/ML IJ SOLN
1.0000 mg | Freq: Once | INTRAMUSCULAR | Status: AC
  Administered 2014-09-30: 1 mg via INTRAVENOUS
  Filled 2014-09-30: qty 1

## 2014-09-30 MED ORDER — ONDANSETRON HCL 4 MG/2ML IJ SOLN
INTRAMUSCULAR | Status: AC
  Filled 2014-09-30: qty 2

## 2014-09-30 MED ORDER — SODIUM CHLORIDE 0.9 % IV BOLUS (SEPSIS)
1000.0000 mL | INTRAVENOUS | Status: AC
  Administered 2014-09-30: 1000 mL via INTRAVENOUS

## 2014-09-30 MED ORDER — ONDANSETRON 4 MG PO TBDP
ORAL_TABLET | ORAL | Status: AC
  Filled 2014-09-30: qty 1

## 2014-09-30 MED ORDER — ONDANSETRON HCL 4 MG/2ML IJ SOLN
4.0000 mg | Freq: Once | INTRAMUSCULAR | Status: AC
  Administered 2014-09-30: 4 mg via INTRAVENOUS

## 2014-09-30 NOTE — ED Notes (Signed)
abd pain with bloody d/c x 2 days-dizziness x 3 days

## 2014-09-30 NOTE — ED Provider Notes (Signed)
CSN: [id number]     Arrival date & time 09/30/14  1802 History   First MD Initiated Contact with Patient 09/30/14 2059     Chief Complaint  Patient presents with  . Abdominal Pain   (Consider location/radiation/quality/duration/timing/severity/associated sxs/prior Treatment) HPI  Breanna English is a 29 year old female presenting with left lower quadrant pain. She states the pain began 3 days ago and was initially gradual but has become progressively more constant.  She rates the pain now as a 7/10.  She states the pain begins in her back and radiates around her left flank and into her left lower abdomen. She also reports some vaginal discharge and describes as mucous streaked with blood. Her lmp was 4 weeks ago and is due in the next day or 2.  She reports feeling nauseated but denies any vomiting, dysuria, fevers, chills or diarrhea.  Past Medical History  Diagnosis Date  . Ovarian cyst    Past Surgical History  Procedure Laterality Date  . Ovarian cyst removal  2002  . Tubal ligation     No family history on file. History  Substance Use Topics  . Smoking status: Former Games developer  . Smokeless tobacco: Not on file  . Alcohol Use: No   OB History    Gravida Para Term Preterm AB TAB SAB Ectopic Multiple Living   5 3             Review of Systems  Constitutional: Negative for fever and chills.  HENT: Negative for sore throat.   Eyes: Negative for visual disturbance.  Respiratory: Negative for cough and shortness of breath.   Cardiovascular: Negative for chest pain and leg swelling.  Gastrointestinal: Positive for nausea and abdominal pain. Negative for vomiting and diarrhea.  Genitourinary: Positive for flank pain and vaginal discharge. Negative for dysuria and pelvic pain.  Musculoskeletal: Negative for myalgias.  Skin: Negative for rash.  Neurological: Negative for weakness, numbness and headaches.      Allergies  Penicillins  Home Medications   Prior to Admission  medications   Not on File   BP 119/67 mmHg  Pulse 85  Temp(Src) 98.3 F (36.8 C) (Oral)  Resp 18  Ht  (1.6 m)  Wt 250 lb (113.399 kg)  BMI 44.30 kg/m2  SpO2 100%  LMP 09/02/2014 (Approximate) Physical Exam  Constitutional: She appears well-developed and well-nourished. No distress.  HENT:  Head: Normocephalic and atraumatic.  Mouth/Throat: Oropharynx is clear and moist. No oropharyngeal exudate.  Eyes: Conjunctivae are normal.  Neck: Neck supple.  Cardiovascular: Normal rate, regular rhythm and intact distal pulses.   Pulmonary/Chest: Effort normal and breath sounds normal. No respiratory distress. She has no wheezes. She has no rales. She exhibits no tenderness.  Abdominal: Soft. There is no hepatosplenomegaly. There is tenderness in the left lower quadrant. There is no rigidity, no rebound, no guarding, no CVA tenderness, no tenderness at McBurney's point and negative Murphy's sign.    Genitourinary: Uterus is not enlarged and not tender. Cervix exhibits no motion tenderness, no discharge and no friability. Right adnexum displays no tenderness. Left adnexum displays tenderness. Left adnexum displays no mass and no fullness. Vaginal discharge found.  Musculoskeletal: She exhibits no tenderness.  Lymphadenopathy:    She has no cervical adenopathy.  Neurological: She is alert.  Skin: Skin is warm and dry. No rash noted. She is not diaphoretic.  Psychiatric: She has a normal mood and affect.  Nursing note and vitals reviewed.   ED Course  Procedures (  including critical care time) Labs Review Labs Reviewed  WET PREP, GENITAL - Abnormal; Notable for the following:    Clue Cells Wet Prep HPF POC MODERATE (*)    WBC, Wet Prep HPF POC MANY (*)    All other components within normal limits  URINALYSIS, ROUTINE W REFLEX MICROSCOPIC - Abnormal; Notable for the following:    Hgb urine dipstick SMALL (*)    All other components within normal limits  CBC WITH  DIFFERENTIAL/PLATELET - Abnormal; Notable for the following:    WBC 10.8 (*)    All other components within normal limits  BASIC METABOLIC PANEL - Abnormal; Notable for the following:    Calcium 8.0 (*)    Anion gap 3 (*)    All other components within normal limits  PREGNANCY, URINE  URINE MICROSCOPIC-ADD ON  GC/CHLAMYDIA PROBE AMP ([place])    Imaging Review Ct Renal Stone Study  09/30/2014   CLINICAL DATA:  Initial evaluation for acute left abdominal pain.  EXAM: CT ABDOMEN AND PELVIS WITHOUT CONTRAST  TECHNIQUE: Multidetector CT imaging of the abdomen and pelvis was performed following the standard protocol without IV contrast.  COMPARISON:  Prior CT from 11/27/2013.  FINDINGS: Visualized lung bases are clear. No pleural or pericardial effusion.  The liver demonstrates a normal unenhanced appearance. Subtle hyperdensity within the gallbladder lumen suggestive of stones and/ or sludge. Gallbladder is otherwise unremarkable. No biliary dilatation. Spleen, adrenal glands, and pancreas demonstrate a normal unenhanced appearance.  Kidneys are equal in size without evidence for nephrolithiasis or hydronephrosis. No radiopaque calculi seen along the course of either renal collecting system. No hydroureter.  Stomach within normal limits. No evidence for bowel obstruction. Appendix is normal. No abnormal wall thickening or inflammatory fat stranding seen about the bowels.  Bladder within normal limits. Uterus and right ovary are unremarkable. A in left ovarian cyst measuring approximately 2.9 cm is stable from previous.  No free air or fluid. No pathologically enlarged intra-abdominal pelvic lymph nodes.  No acute osseous abnormality. No worrisome lytic or blastic osseous lesion.  IMPRESSION: 1. No CT evidence for acute intra-abdominal or pelvic process identified. 2. Suspected stones and/or sludge within the gallbladder lumen, stable from prior. No evidence for acute cholecystitis.   Electronically  Signed   By: Rise MuBenjamin  McClintock M.D.   On: 09/30/2014 22:03     EKG Interpretation None      MDM   Final diagnoses:  Left flank pain  Bacterial vaginosis  Cyst of left ovary   29 yo with gradually worsening left pelvic and flank pain and some blood streaked mucous like vaginal discharge.  Her urine has a small amount of hgb noted. Her CT is negative for stone but shows a left ovarian cyst. Discussed with pt stones and sludge noted in gall bladder but no signs of cholecystitis. Her wet prep Is consistent with bacterial vaginosis. Doubt ovarian torsion as cause of pain. Her pain was managed in the ED. Discussed use of NSAIDs to treat cyst and prescription of flagyl provided for BV with instructions not to drink alcohol while taking. Pt is well-appearing, in no acute distress and vital signs reviewed and not concerning.  She appears safe to be discharged.  Discharge include follow-up with her gynecologist. Return precautions provided. Pt aware of plan and in agreement.   Filed Vitals:   09/30/14 1815 09/30/14 2215 09/30/14 2257 10/01/14 0052  BP: 119/67 105/57 108/60 106/68  Pulse: 85 62 58 84  Temp: 98.3 F (36.8  C) 98.3 F (36.8 C)    TempSrc: Oral Oral    Resp: Height:  (1.6 m)     Weight: 250 lb (113.399 kg)     SpO2: 100% 99% 99% 99%    Meds given in ED:  Medications  sodium chloride 0.9 % bolus 1,000 mL (0 mLs Intravenous Stopped 09/30/14 2236)  HYDROmorphone (DILAUDID) injection 1 mg (1 mg Intravenous Given 09/30/14 2128)  ondansetron (ZOFRAN) injection 4 mg (4 mg Intravenous Given 09/30/14 2128)  HYDROmorphone (DILAUDID) injection 1 mg (1 mg Intravenous Given 09/30/14 2309)  ondansetron (ZOFRAN) injection 4 mg (4 mg Intravenous Given 09/30/14 2338)    Discharge Medication List as of 10/01/2014 12:37 AM    START taking these medications   Details  metroNIDAZOLE (FLAGYL) 500 MG tablet Take 1 tablet (500 mg total) by mouth 2 (two) times daily., Starting  10/01/2014, Until Discontinued, Print    naproxen (NAPROSYN) 500 MG tablet Take 1 tablet (500 mg total) by mouth 2 (two) times daily., Starting 10/01/2014, Until Discontinued, Print           Harle Battiest, NP 10/01/14 1501  Rolan Bucco, MD 10/01/14 1504

## 2014-10-01 LAB — GC/CHLAMYDIA PROBE AMP (~~LOC~~) NOT AT ARMC
CHLAMYDIA, DNA PROBE: NEGATIVE
Neisseria Gonorrhea: NEGATIVE

## 2014-10-01 MED ORDER — METRONIDAZOLE 500 MG PO TABS: 500.0000 mg | ORAL_TABLET | Freq: Two times a day (BID) | ORAL | Status: DC

## 2014-10-01 MED ORDER — NAPROXEN 500 MG PO TABS: 500.0000 mg | ORAL_TABLET | Freq: Two times a day (BID) | ORAL | Status: DC

## 2014-10-01 NOTE — ED Notes (Signed)
Patient given ice chips.  Patient reports itching all over.  NP made aware.  No signs of allergic reaction noted.  Airway patent, no hives noted.

## 2014-10-01 NOTE — ED Notes (Signed)
NP at bedside.

## 2014-10-01 NOTE — Discharge Instructions (Signed)
Please follow the directions provided. Be sure to follow-up with your gynecologist to ensure you're getting better. Please take the antibiotic as directed until it is all gone. Please take the naproxen twice a day to help with pain and inflammation. Don't hesitate to return for any new, worsening, or concerning symptoms.   SEEK IMMEDIATE MEDICAL CARE IF:  You have increasing abdominal pain.  You feel sick to your stomach (nauseous), and you throw up (vomit).  You develop a fever that comes on suddenly.  You have abdominal pain during a bowel movement.  Your menstrual periods become heavier than usual.

## 2015-07-10 ENCOUNTER — Encounter (HOSPITAL_BASED_OUTPATIENT_CLINIC_OR_DEPARTMENT_OTHER): Payer: Self-pay | Admitting: *Deleted

## 2015-07-10 ENCOUNTER — Emergency Department (HOSPITAL_BASED_OUTPATIENT_CLINIC_OR_DEPARTMENT_OTHER): Payer: Self-pay

## 2015-07-10 ENCOUNTER — Emergency Department (HOSPITAL_BASED_OUTPATIENT_CLINIC_OR_DEPARTMENT_OTHER)
Admission: EM | Admit: 2015-07-10 | Discharge: 2015-07-10 | Disposition: A | Discharge status: Home / Self Care | Payer: Self-pay | Attending: Emergency Medicine | Admitting: Emergency Medicine

## 2015-07-10 DIAGNOSIS — Z3202 Encounter for pregnancy test, result negative: Secondary | ICD-10-CM | POA: Insufficient documentation

## 2015-07-10 DIAGNOSIS — Z8742 Personal history of other diseases of the female genital tract: Secondary | ICD-10-CM | POA: Insufficient documentation

## 2015-07-10 DIAGNOSIS — F17210 Nicotine dependence, cigarettes, uncomplicated: Secondary | ICD-10-CM | POA: Insufficient documentation

## 2015-07-10 DIAGNOSIS — R69 Illness, unspecified: Secondary | ICD-10-CM

## 2015-07-10 DIAGNOSIS — Z880 Allergy status to penicillin: Secondary | ICD-10-CM | POA: Insufficient documentation

## 2015-07-10 DIAGNOSIS — Z792 Long term (current) use of antibiotics: Secondary | ICD-10-CM | POA: Insufficient documentation

## 2015-07-10 DIAGNOSIS — Z791 Long term (current) use of non-steroidal anti-inflammatories (NSAID): Secondary | ICD-10-CM | POA: Insufficient documentation

## 2015-07-10 DIAGNOSIS — J111 Influenza due to unidentified influenza virus with other respiratory manifestations: Secondary | ICD-10-CM | POA: Insufficient documentation

## 2015-07-10 LAB — URINALYSIS, ROUTINE W REFLEX MICROSCOPIC
BILIRUBIN URINE: NEGATIVE
GLUCOSE, UA: NEGATIVE mg/dL
KETONES UR: NEGATIVE mg/dL
Leukocytes, UA: NEGATIVE
Nitrite: NEGATIVE
PH: 6 (ref 5.0–8.0)
PROTEIN: NEGATIVE mg/dL
Specific Gravity, Urine: 1.004 — ABNORMAL LOW (ref 1.005–1.030)

## 2015-07-10 LAB — COMPREHENSIVE METABOLIC PANEL
ALK PHOS: 65 U/L (ref 38–126)
ALT: 21 U/L (ref 14–54)
AST: 25 U/L (ref 15–41)
Albumin: 3.8 g/dL (ref 3.5–5.0)
Anion gap: 10 (ref 5–15)
BILIRUBIN TOTAL: 0.4 mg/dL (ref 0.3–1.2)
BUN: 13 mg/dL (ref 6–20)
CALCIUM: 8.2 mg/dL — AB (ref 8.9–10.3)
CO2: 24 mmol/L (ref 22–32)
Chloride: 103 mmol/L (ref 101–111)
Creatinine, Ser: 0.75 mg/dL (ref 0.44–1.00)
Glucose, Bld: 94 mg/dL (ref 65–99)
Potassium: 3.5 mmol/L (ref 3.5–5.1)
Sodium: 137 mmol/L (ref 135–145)
Total Protein: 8 g/dL (ref 6.5–8.1)

## 2015-07-10 LAB — PREGNANCY, URINE: PREG TEST UR: NEGATIVE

## 2015-07-10 LAB — CBC WITH DIFFERENTIAL/PLATELET
BASOS PCT: 0 %
Basophils Absolute: 0 10*3/uL (ref 0.0–0.1)
EOS ABS: 0.1 10*3/uL (ref 0.0–0.7)
Eosinophils Relative: 1 %
HCT: 34.6 % — ABNORMAL LOW (ref 36.0–46.0)
HEMOGLOBIN: 11.1 g/dL — AB (ref 12.0–15.0)
Lymphocytes Relative: 28 %
Lymphs Abs: 1.8 10*3/uL (ref 0.7–4.0)
MCH: 29.1 pg (ref 26.0–34.0)
MCHC: 32.1 g/dL (ref 30.0–36.0)
MCV: 90.6 fL (ref 78.0–100.0)
Monocytes Absolute: 1.4 10*3/uL — ABNORMAL HIGH (ref 0.1–1.0)
Monocytes Relative: 22 %
NEUTROS PCT: 49 %
Neutro Abs: 3.1 10*3/uL (ref 1.7–7.7)
Platelets: 217 10*3/uL (ref 150–400)
RBC: 3.82 MIL/uL — AB (ref 3.87–5.11)
RDW: 13.5 % (ref 11.5–15.5)
WBC: 6.4 10*3/uL (ref 4.0–10.5)

## 2015-07-10 LAB — URINE MICROSCOPIC-ADD ON: WBC, UA: NONE SEEN WBC/hpf (ref 0–5)

## 2015-07-10 LAB — RAPID STREP SCREEN (MED CTR MEBANE ONLY): Streptococcus, Group A Screen (Direct): NEGATIVE

## 2015-07-10 LAB — I-STAT CG4 LACTIC ACID, ED: Lactic Acid, Venous: 0.79 mmol/L (ref 0.5–2.0)

## 2015-07-10 MED ORDER — IBUPROFEN 800 MG PO TABS
800.0000 mg | ORAL_TABLET | Freq: Once | ORAL | Status: AC
  Administered 2015-07-10: 800 mg via ORAL
  Filled 2015-07-10: qty 1

## 2015-07-10 MED ORDER — ACETAMINOPHEN 500 MG PO TABS
1000.0000 mg | ORAL_TABLET | Freq: Once | ORAL | Status: AC
  Administered 2015-07-10: 1000 mg via ORAL
  Filled 2015-07-10: qty 2

## 2015-07-10 NOTE — ED Notes (Signed)
Fever, cough, chills, headache and body aches since yesterday.

## 2015-07-10 NOTE — Discharge Instructions (Signed)
Influenza, Adult Take Tylenol every 4 hours as directed for aches or for temperature higher than 100.4. Drink at least six 8 ounce glasses of water or Gatorade each day. Call the [facility] and community wellness Center or any of the numbers on the resource guide to get a primary care physician or see your OB/GYN if not feeling better in a week. Ask your doctor to help you to stop smoking. Influenza ("the flu") is a viral infection of the respiratory tract. It occurs more often in winter months because people spend more time in close contact with one another. Influenza can make you feel very sick. Influenza easily spreads from person to person (contagious). CAUSES  Influenza is caused by a virus that infects the respiratory tract. You can catch the virus by breathing in droplets from an infected person's cough or sneeze. You can also catch the virus by touching something that was recently contaminated with the virus and then touching your mouth, nose, or eyes. RISKS AND COMPLICATIONS You may be at risk for a more severe case of influenza if you smoke cigarettes, have diabetes, have chronic heart disease (such as heart failure) or lung disease (such as asthma), or if you have a weakened immune system. Elderly people and pregnant women are also at risk for more serious infections. The most common problem of influenza is a lung infection (pneumonia). Sometimes, this problem can require emergency medical care and may be life threatening. SIGNS AND SYMPTOMS  Symptoms typically last 4 to 10 days and may include:  Fever.  Chills.  Headache, body aches, and muscle aches.  Sore throat.  Chest discomfort and cough.  Poor appetite.  Weakness or feeling tired.  Dizziness.  Nausea or vomiting. DIAGNOSIS  Diagnosis of influenza is often made based on your history and a physical exam. A nose or throat swab test can be done to confirm the diagnosis. TREATMENT  In mild cases, influenza goes away on its  own. Treatment is directed at relieving symptoms. For more severe cases, your health care provider may prescribe antiviral medicines to shorten the sickness. Antibiotic medicines are not effective because the infection is caused by a virus, not by bacteria. HOME CARE INSTRUCTIONS  Take medicines only as directed by your health care provider.  Use a cool mist humidifier to make breathing easier.  Get plenty of rest until your temperature returns to normal. This usually takes 3 to 4 days.  Drink enough fluid to keep your urine clear or pale yellow.  Cover yourmouth and nosewhen coughing or sneezing,and wash your handswellto prevent thevirusfrom spreading.  Stay homefromwork orschool untilthe fever is gonefor at least 84full day. PREVENTION  An annual influenza vaccination (flu shot) is the best way to avoid getting influenza. An annual flu shot is now routinely recommended for all adults in the [place] SEEK MEDICAL CARE IF:  You experiencechest pain, yourcough worsens,or you producemore mucus.  Youhave nausea,vomiting, ordiarrhea.  Your fever returns or gets worse. SEEK IMMEDIATE MEDICAL CARE IF:  You havetrouble breathing, you become short of breath,or your skin ornails becomebluish.  You have severe painor stiffnessin the neck.  You develop a sudden headache, or pain in the face or ear.  You have nausea or vomiting that you cannot control. MAKE SURE YOU:   Understand these instructions.  Will watch your condition.  Will get help right away if you are not doing well or get worse.   This information is not intended to replace advice given to you  by your health care provider. Make sure you discuss any questions you have with your health care provider.   Document Released: 05/14/2000 Document Revised: 06/07/2014 Document Reviewed: 08/16/2011 Elsevier Interactive Patient Education 2016 [employer].  Emergency Department Resource Guide 1) Find a Doctor  and Pay Out of Pocket Although you won't have to find out who is covered by your insurance plan, it is a good idea to ask around and get recommendations. You will then need to call the office and see if the doctor you have chosen will accept you as a new patient and what types of options they offer for patients who are self-pay. Some doctors offer discounts or will set up payment plans for their patients who do not have insurance, but you will need to ask so you aren't surprised when you get to your appointment.  2) Contact Your Local Health Department Not all health departments have doctors that can see patients for sick visits, but many do, so it is worth a call to see if yours does. If you don't know where your local health department is, you can check in your phone book. The CDC also has a tool to help you locate your state's health department, and many state websites also have listings of all of their local health departments.  3) Find a Walk-in Clinic If your illness is not likely to be very severe or complicated, you may want to try a walk in clinic. These are popping up all over the country in pharmacies, drugstores, and shopping centers. They're usually staffed by nurse practitioners or physician assistants that have been trained to treat common illnesses and complaints. They're usually fairly quick and inexpensive. However, if you have serious medical issues or chronic medical problems, these are probably not your best option.  No Primary Care Doctor: - Call Health Connect at  [phone] - they can help you locate a primary care doctor that  accepts your insurance, provides certain services, etc. - Physician Referral Service- [phone]  Chronic Pain Problems: Organization         Address  Phone   Notes  [facility]  [phone] Patients need to be referred by their primary care doctor.   Medication Assistance: Organization         Address  Phone    Notes  [facility] Medication [facility] [address] [place], [place] [address] [phone] --Must be a resident of [facility] -- Must have NO insurance coverage whatsoever (no Medicaid/ Medicare, etc.) -- The pt. MUST have a primary care doctor that directs their care regularly and follows them in the community   [facility]  [phone]   [employer]  [phone]    Agencies that provide inexpensive medical care: Organization         Address  Phone   Notes  [facility]  [phone]   [facility]    [phone]   [facility] [address] [place], [place] [address] [phone]   [facility] [address] [place], [place] [phone]   Planned Parenthood    [phone]   [facility]    [phone]   [facility]  [address], [place] Phone:  [phone], Fax:  [phone] Hours of Operation:  9 am - 6 pm, M-F.  Also accepts Medicaid/Medicare and self-pay.  [facility]  [address], [place] Phone: [phone], Fax: [phone]. Hours of Operation:  8:30 am - 5:30 pm, M-F.  Also accepts Medicaid and self-pay.  [facility] [place] [address] [place] Phone: [phone]   [facility] [address] Natasha Bence [place], [place] [phone], Ext. [phone] Mondays & Thursdays: 7-9 AM.  First 15 patients are seen on a first come, first serve basis.    Medicaid-accepting [facility] Providers:  Organization         Address  Phone   Notes  [facility] [address], [place] [phone] Also accepts self-pay patients.  [facility] [address] Laurell Josephs [place], [place]  [phone]   [facility] [address], [place] [phone]   [facility]  [facility] [address] [place] [phone]   Renaye Rakers [address], [place]   [phone] Only accepts [place] Access [place] patients after they have their name applied to their card.   Self-Pay (no insurance) in [facility]:  Organization         Address  Phone   Notes  Sickle Cell Patients, [facility] Internal [facility] [address] [place], [place] [phone]   [facility] Urgent Care [address] [place], [place] [phone]   [facility] [place]  [address], [place] [phone]   [facility]/Dr. Osei-Bonsu  [address] [place] or [address], [place] [phone] Phone number for both [place] and [place] locations is the same.  Urgent Medical and [facility] [address] [place] [phone]   [facility] [address] [place] or [address] [phone]   [facility] [address] [place] [phone], phone; [phone], fax Sees patients 1st and 3rd Saturday of every month.  Must not qualify for public or private insurance (i.e. Medicaid, Medicare, [place] Health Choice, Veterans' Benefits)  Household income should be no more than 200% of the poverty level The clinic cannot treat you if you are pregnant or think you are pregnant  Sexually transmitted diseases are not treated at the clinic.    Dental Care: Organization         Address  Phone  Notes  [facility] Department of [facility] [address] [place], [place] [phone] Accepts children up to age 48 who are enrolled in [place] or [place] Health Choice; pregnant women with a Medicaid card; and children who have applied for Medicaid or [place] Health Choice, but were declined, whose parents can pay a reduced fee at time of service.  [facility] of [facility]  [address], [place] [phone] Accepts children up to age 34 who are enrolled in [place] or [place] Health Choice; pregnant women with a Medicaid card; and children who have applied for Medicaid or [place] Health Choice, but were declined, whose parents can pay a reduced fee at time of service.  [facility]  [address] [place], [place] [phone] Patients are seen by appointment only. Walk-ins are not accepted. [facility] will see patients 79 years of age and older. Monday - Tuesday (8am-5pm) Most Wednesdays (8:30-5pm) $30 per visit, cash  only  [facility] Adult Dental Access PRO[facility]  [address], [facility] [phone] Patients are seen by appointment only. Walk-ins are not accepted. [facility] will see patients 38 years of age and older. One Wednesday Evening (Monthly: Volunteer Based).  $30 per visit, cash only  [employer]  [phone] for adults; Children under age 81, call Graduate Pediatric Dentistry at [phone]. Children aged 73-14, please call [phone] to request a pediatric application.  Dental services are provided in all areas of dental care including fillings, crowns and bridges, complete and partial dentures, implants, gum treatment, root canals, and extractions. Preventive care is also provided. Treatment is provided to both adults and children. Patients are selected via a lottery and there is often a waiting list.   [facility] [address] [place]  [phone] [email or web address]   [facility] [address] [place], [place] [phone], Ext. [phone] Second and Fourth Thursday of each month, opens at 6:30 AM; Clinic ends at 9 AM.  Patients are seen on a first-come first-served basis, and a limited number are seen during each clinic.   [facility]  [address] [place], [place] [phone]   Eligibility Requirements You must have lived in  [place], [place], or [place] counties for at least the last three months.   You cannot be eligible for state or federal sponsored [place], including [employer], [place], or [employer].   You generally cannot be eligible for healthcare insurance through your employer.    How to apply: Eligibility screenings are held every Tuesday and Wednesday afternoon from 1:00 pm until 4:00 pm. You do not need an appointment for the interview!  [facility] [address], [place], [place] [phone]   [facility] Health Department  [phone]   [facility] Health Department  [phone]   [facility] Health Department  [phone]    Behavioral Health Resources in the Community: Intensive Outpatient Programs Organization         Address  Phone  Notes  [facility] [address], [place], [place] [phone]   [facility] Outpatient [address], [place], [place] [phone]   [facility] [address] [place], [place]  [phone]   [facility] Mental Health [address],  [place], [place] [phone] or [phone]   Substance Abuse Resources Organization         Address  Phone  Notes  [facility]  [phone]   [facility]  [phone]   [facility] [place]  [phone]   Floydene Flock  [phone]   Residential & Outpatient Substance Abuse Program  [phone]   Psychological Services Organization         Address  Phone  Notes  [facility]  [phone]   [facility] Services  [phone]   [facility] [address], [place] [phone] or [phone]    Mobile Crisis Teams Organization         Address  Phone  Notes  [facility], Mobile Crisis Care Unit  [phone]   [facility]  [address] [place], [place] [phone]   Doristine Locks [address] [place] [phone]    Self-Help/Support Groups Organization         Address  Phone  Notes  [facility] [place] - variety of support groups  [phone]- [phone] Call for more information  Narcotics Anonymous (NA), [facility] [address], [employer] [place]  2 meetings at this location   Statistician         Address  Phone  Notes  [facility] [address],    [place]  [phone]   [facility]  [address] [place] [address], [place], [place] [phone]   [facility] Treatment Facility [address] [place], [place] [phone] Admissions: 8am-3pm M-F  [facility] [address],    [place], [place] [phone]   [facility] [address] [place], [place], [place] [phone]   [facility] [address],  [place], [place] [phone]   [facility] - Intensive Outpatient [address] Laurell Josephs [address], [place], [place] [phone]   [facility] ([facility] [address] [place].,  [place], [place] [phone] or [phone]   [facility] ([facility]) [address], [place], [place] [phone] Accepts Medicaid  [facility] [address],  [place] [phone] Substance Abuse/Addiction Treatment   [facility] Organization         Address  Phone  Notes  [facility]  [phone]   Angie Fava, PhD [address] Ervin Knack [place], [place]   [phone] or [phone]   [facility]   [address] [place], [place] [phone]   [facility] [address], [place], [place] [phone] Insurance/Medicaid/sponsorship through [facility] and Families [address]                                    [place], [place] [phone] Therapy/tele-psych/case  [facility] [address], [place] [phone]    Dr. Lolly Mustache  [phone]   [facility]  [employer] [facility] Dept. 1) [address], [place] 2) [address] [place] 3)  [address], [place] [phone]  [phone]   [facility] Child Abuse Hotline [phone] or [phone] (After Hours)

## 2015-07-10 NOTE — ED Provider Notes (Signed)
CSN: [id number]     Arrival date & time 07/10/15  2001 History  By signing my name below, I, Phillis Haggis, attest that this documentation has been prepared under the direction and in the presence of Doug Sou, MD. Electronically Signed: Phillis Haggis, ED Scribe. 07/10/2015. 10:11 PM.   Chief Complaint  Patient presents with  . Influenza   The history is provided by the patient. No language interpreter was used.  HPI Comments: Breanna English is a 30 y.o. female who presents to the Emergency Department complaining of generalized body aches, fever tmax 102.5 F onset one day ago. Pt reports associated intermittent headache and chest pain secondary to cough, chills, dry cough, and sore throat. Pt has been talking Alka Seltzer Cold and Flu to no relief. She did not receive a flu vaccination this year. Pt was given Tylenol in the ED. Pt is an intermittent smoker. She is currently on her menstrual period. She denies use of alcohol or illegal drugs, nausea, vomiting or diarrhea.   Past Medical History  Diagnosis Date  . Ovarian cyst    Past Surgical History  Procedure Laterality Date  . Ovarian cyst removal  2002  . Tubal ligation     No family history on file. Social History  Substance Use Topics  . Smoking status: Current Some Day Smoker    Types: Cigarettes  . Smokeless tobacco: None  . Alcohol Use: No   OB History    Gravida Para Term Preterm AB TAB SAB Ectopic Multiple Living   5 3             Review of Systems  Constitutional: Positive for fever and chills.  HENT: Positive for sore throat.   Respiratory: Positive for cough.   Cardiovascular: Positive for chest pain.  Gastrointestinal: Negative for nausea, vomiting and diarrhea.  Genitourinary:       Currently on menses  Musculoskeletal: Positive for myalgias.  Neurological: Positive for headaches.  All other systems reviewed and are negative.  Allergies  Penicillins  Home Medications   Prior to Admission  medications   Medication Sig Start Date End Date Taking? Authorizing Provider  metroNIDAZOLE (FLAGYL) 500 MG tablet Take 1 tablet (500 mg total) by mouth 2 (two) times daily. 10/01/14   Harle Battiest, NP  naproxen (NAPROSYN) 500 MG tablet Take 1 tablet (500 mg total) by mouth 2 (two) times daily. 10/01/14   Harle Battiest, NP   BP 103/81 mmHg  Pulse 89  Temp(Src) 101.3 F (38.5 C) (Oral)  Resp 20  Ht  (1.6 m)  Wt 247 lb (112.038 kg)  BMI 43.76 kg/m2  SpO2 98%  LMP 07/07/2015 Physical Exam  Constitutional: She appears well-developed and well-nourished. No distress.  HENT:  Head: Normocephalic and atraumatic.  Right Ear: External ear normal.  Left Ear: External ear normal.  Nose: Nose normal.  Mouth/Throat: No oropharyngeal exudate.  Oropharynx minimally reddened. No exudate. Uvula midline. Bilateral tympanic membranes normal.  Eyes: Conjunctivae are normal. Pupils are equal, round, and reactive to light.  Neck: Neck supple. No tracheal deviation present. No thyromegaly present.  Cardiovascular: Normal rate and regular rhythm.   No murmur heard. Pulmonary/Chest: Effort normal and breath sounds normal.  Abdominal: Soft. Bowel sounds are normal. She exhibits no distension. There is no tenderness.  Obese  Musculoskeletal: Normal range of motion. She exhibits no edema or tenderness.  Lymphadenopathy:    She has no cervical adenopathy.  Neurological: She is alert. Coordination normal.  Skin: Skin is  warm and dry. No rash noted.  Psychiatric: She has a normal mood and affect.  Nursing note and vitals reviewed.   ED Course  Procedures (including critical care time) DIAGNOSTIC STUDIES: Oxygen Saturation is 98% on RA, normal by my interpretation.    COORDINATION OF CARE: 10:10 PM-Discussed treatment plan which includes labs and x-ray with pt at bedside and pt agreed to plan.    Labs Review Labs Reviewed  COMPREHENSIVE METABOLIC PANEL - Abnormal; Notable for the  following:    Calcium 8.2 (*)    All other components within normal limits  CBC WITH DIFFERENTIAL/PLATELET - Abnormal; Notable for the following:    RBC 3.82 (*)    Hemoglobin 11.1 (*)    HCT 34.6 (*)    Monocytes Absolute 1.4 (*)    All other components within normal limits  URINALYSIS, ROUTINE W REFLEX MICROSCOPIC (NOT AT [facility]) - Abnormal; Notable for the following:    Specific Gravity, Urine 1.004 (*)    Hgb urine dipstick MODERATE (*)    All other components within normal limits  URINE MICROSCOPIC-ADD ON - Abnormal; Notable for the following:    Squamous Epithelial / LPF 0-5 (*)    Bacteria, UA RARE (*)    All other components within normal limits  RAPID STREP SCREEN (NOT AT [facility])  CULTURE, BLOOD (ROUTINE X 2)  CULTURE, BLOOD (ROUTINE X 2)  URINE CULTURE  CULTURE, GROUP A STREP [facility])  PREGNANCY, URINE  I-STAT CG4 LACTIC ACID, ED    Imaging Review Dg Chest 2 View  07/10/2015  CLINICAL DATA:  Fever EXAM: CHEST  2 VIEW COMPARISON:  None. Patient's prior chest x-ray from 2010 is not available for comparison. FINDINGS: The heart size and mediastinal contours are within normal limits. The lung volumes are low. Both lungs are clear. The visualized skeletal structures are unremarkable. IMPRESSION: No active cardiopulmonary disease. Electronically Signed   By: Sherian Rein M.D.   On: 07/10/2015 21:21   I have personally reviewed and evaluated these images and lab results as part of my medical decision-making.   EKG Interpretation None     chest x-ray reviewed by me Results for orders placed or performed during the hospital encounter of 07/10/15  Culture, blood (routine x 2)  Result Value Ref Range   Specimen Description      BLOOD RIGHT ANTECUBITAL Performed at [facility]    Special Requests BOTTLES DRAWN AEROBIC AND ANAEROBIC 5CC EACH    Culture PENDING    Report Status PENDING   Rapid strep screen  Result Value Ref Range   Streptococcus, Group A Screen (Direct)  NEGATIVE NEGATIVE  Comprehensive metabolic panel  Result Value Ref Range   Sodium 137 135 - 145 mmol/L   Potassium 3.5 3.5 - 5.1 mmol/L   Chloride 103 101 - 111 mmol/L   CO2 24 22 - 32 mmol/L   Glucose, Bld 94 65 - 99 mg/dL   BUN 13 6 - 20 mg/dL   Creatinine, Ser 1.61 0.44 - 1.00 mg/dL   Calcium 8.2 (L) 8.9 - 10.3 mg/dL   Total Protein 8.0 6.5 - 8.1 g/dL   Albumin 3.8 3.5 - 5.0 g/dL   AST 25 15 - 41 U/L   ALT 21 14 - 54 U/L   Alkaline Phosphatase 65 38 - 126 U/L   Total Bilirubin 0.4 0.3 - 1.2 mg/dL   GFR calc non Af Amer >60 >60 mL/min   GFR calc Af Amer >60 >60 mL/min  Anion gap 10 5 - 15  CBC with Differential  Result Value Ref Range   WBC 6.4 4.0 - 10.5 K/uL   RBC 3.82 (L) 3.87 - 5.11 MIL/uL   Hemoglobin 11.1 (L) 12.0 - 15.0 g/dL   HCT 78.4 (L) 69.6 - 29.5 %   MCV 90.6 78.0 - 100.0 fL   MCH 29.1 26.0 - 34.0 pg   MCHC 32.1 30.0 - 36.0 g/dL   RDW 28.4 13.2 - 44.0 %   Platelets 217 150 - 400 K/uL   Neutrophils Relative % 49 %   Neutro Abs 3.1 1.7 - 7.7 K/uL   Lymphocytes Relative 28 %   Lymphs Abs 1.8 0.7 - 4.0 K/uL   Monocytes Relative 22 %   Monocytes Absolute 1.4 (H) 0.1 - 1.0 K/uL   Eosinophils Relative 1 %   Eosinophils Absolute 0.1 0.0 - 0.7 K/uL   Basophils Relative 0 %   Basophils Absolute 0.0 0.0 - 0.1 K/uL  Urinalysis, Routine w reflex microscopic (not at [facility])  Result Value Ref Range   Color, Urine YELLOW YELLOW   APPearance CLEAR CLEAR   Specific Gravity, Urine 1.004 (L) 1.005 - 1.030   pH 6.0 5.0 - 8.0   Glucose, UA NEGATIVE NEGATIVE mg/dL   Hgb urine dipstick MODERATE (A) NEGATIVE   Bilirubin Urine NEGATIVE NEGATIVE   Ketones, ur NEGATIVE NEGATIVE mg/dL   Protein, ur NEGATIVE NEGATIVE mg/dL   Nitrite NEGATIVE NEGATIVE   Leukocytes, UA NEGATIVE NEGATIVE  Pregnancy, urine  Result Value Ref Range   Preg Test, Ur NEGATIVE NEGATIVE  Urine microscopic-add on  Result Value Ref Range   Squamous Epithelial / LPF 0-5 (A) NONE SEEN   WBC, UA NONE SEEN  0 - 5 WBC/hpf   RBC / HPF 6-30 0 - 5 RBC/hpf   Bacteria, UA RARE (A) NONE SEEN  I-Stat CG4 Lactic Acid, ED (Not at [facility])  Result Value Ref Range   Lactic Acid, Venous 0.79 0.5 - 2.0 mmol/L   Dg Chest 2 View  07/10/2015  CLINICAL DATA:  Fever EXAM: CHEST  2 VIEW COMPARISON:  None. Patient's prior chest x-ray from 2010 is not available for comparison. FINDINGS: The heart size and mediastinal contours are within normal limits. The lung volumes are low. Both lungs are clear. The visualized skeletal structures are unremarkable. IMPRESSION: No active cardiopulmonary disease. Electronically Signed   By: Sherian Rein M.D.   On: 07/10/2015 21:21    MDM  Pt with influenza like illness. Plan tylenol encourage oral hydration. Pt counseled for 5 minutes on smoking cessation. Referral wellness ctr or resource guids Dx #1 influenza like illness #2 tobacco abuse Final diagnoses:  None     I personally performed the services described in this documentation, which was scribed in my presence. The recorded information has been reviewed and considered.     Doug Sou, MD 07/10/15 2232

## 2015-07-10 NOTE — ED Notes (Signed)
Pt verbalizes understanding of d/c instructions and denies any further needs at this time. 

## 2015-07-10 NOTE — ED Notes (Signed)
Pt c/o generalized body aches, cough, sore throat and fever since yesterday.

## 2015-07-15 LAB — CULTURE, BLOOD (ROUTINE X 2): Culture: NO GROWTH

## 2016-09-23 ENCOUNTER — Emergency Department (HOSPITAL_BASED_OUTPATIENT_CLINIC_OR_DEPARTMENT_OTHER)
Admission: EM | Admit: 2016-09-23 | Discharge: 2016-09-23 | Disposition: A | Discharge status: Home / Self Care | Payer: Managed Care, Other (non HMO) | Attending: Emergency Medicine | Admitting: Emergency Medicine

## 2016-09-23 ENCOUNTER — Encounter (HOSPITAL_BASED_OUTPATIENT_CLINIC_OR_DEPARTMENT_OTHER): Payer: Self-pay | Admitting: Emergency Medicine

## 2016-09-23 DIAGNOSIS — F17210 Nicotine dependence, cigarettes, uncomplicated: Secondary | ICD-10-CM | POA: Insufficient documentation

## 2016-09-23 DIAGNOSIS — R102 Pelvic and perineal pain: Secondary | ICD-10-CM | POA: Insufficient documentation

## 2016-09-23 DIAGNOSIS — N939 Abnormal uterine and vaginal bleeding, unspecified: Secondary | ICD-10-CM

## 2016-09-23 LAB — BASIC METABOLIC PANEL
Anion gap: 8 (ref 5–15)
BUN: 13 mg/dL (ref 6–20)
CO2: 26 mmol/L (ref 22–32)
Calcium: 8.8 mg/dL — ABNORMAL LOW (ref 8.9–10.3)
Chloride: 103 mmol/L (ref 101–111)
Creatinine, Ser: 0.8 mg/dL (ref 0.44–1.00)
GFR calc Af Amer: >60 mL/min (ref >60)
Glucose, Bld: 90 mg/dL (ref 65–99)
POTASSIUM: 3.9 mmol/L (ref 3.5–5.1)
SODIUM: 137 mmol/L (ref 135–145)

## 2016-09-23 LAB — URINALYSIS, ROUTINE W REFLEX MICROSCOPIC

## 2016-09-23 LAB — CBC WITH DIFFERENTIAL/PLATELET
Basophils Absolute: 0 10*3/uL (ref 0.0–0.1)
Basophils Relative: 1 %
EOS PCT: 3 %
Eosinophils Absolute: 0.2 10*3/uL (ref 0.0–0.7)
HCT: 34.4 % — ABNORMAL LOW (ref 36.0–46.0)
HEMOGLOBIN: 11.2 g/dL — AB (ref 12.0–15.0)
Lymphocytes Relative: 32 %
Lymphs Abs: 2.6 10*3/uL (ref 0.7–4.0)
MCH: 28.9 pg (ref 26.0–34.0)
MCHC: 32.6 g/dL (ref 30.0–36.0)
MCV: 88.9 fL (ref 78.0–100.0)
MONO ABS: 0.8 10*3/uL (ref 0.1–1.0)
MONOS PCT: 10 %
NEUTROS ABS: 4.3 10*3/uL (ref 1.7–7.7)
Neutrophils Relative %: 54 %
PLATELETS: 302 10*3/uL (ref 150–400)
RBC: 3.87 MIL/uL (ref 3.87–5.11)
RDW: 14.1 % (ref 11.5–15.5)
WBC: 7.9 10*3/uL (ref 4.0–10.5)

## 2016-09-23 LAB — ABO/RH: ABO/RH(D): A POS

## 2016-09-23 LAB — HCG, QUANTITATIVE, PREGNANCY

## 2016-09-23 LAB — WET PREP, GENITAL
SPERM: NONE SEEN
TRICH WET PREP: NONE SEEN
Yeast Wet Prep HPF POC: NONE SEEN

## 2016-09-23 LAB — PREGNANCY, URINE: PREG TEST UR: NEGATIVE

## 2016-09-23 LAB — URINALYSIS, MICROSCOPIC (REFLEX)

## 2016-09-23 MED ORDER — NAPROXEN 500 MG PO TABS: 500.0000 mg | ORAL_TABLET | Freq: Two times a day (BID) | ORAL | 0 refills | Status: DC

## 2016-09-23 MED ORDER — ACETAMINOPHEN 500 MG PO TABS
1000.0000 mg | ORAL_TABLET | Freq: Once | ORAL | Status: AC
  Administered 2016-09-23: 1000 mg via ORAL
  Filled 2016-09-23: qty 2

## 2016-09-23 NOTE — ED Provider Notes (Signed)
[facility] Provider Note   CSN: [id number] Arrival date & time: 09/23/16  1757  By signing my name below, I, Modena Jansky, attest that this documentation has been prepared under the direction and in the presence of non-physician practitioner, Arthor Captain, PA-C. Electronically Signed: Modena Jansky, Scribe. 09/23/2016. 8:04 PM.  History   Chief Complaint Chief Complaint  Patient presents with  . Vaginal Bleeding    pregnant   The history is provided by the patient. No language interpreter was used.   HPI Comments: Breanna English is a 31 y.o. female who presents to the Emergency Department complaining of constant moderate vaginal bleeding that started about 3 hours ago. She tested positive for pregnancy despite prior tubal ligation. Her passed blood contained clots and "meaty stuff". No modifying factors. She has an OB/GYN appointment next week. Her LNMP was 08/24/16. She reports associated abdominal cramps and headache. Denies any other complaints at this time.  Past Medical History:  Diagnosis Date  . Ovarian cyst     There are no active problems to display for this patient.   Past Surgical History:  Procedure Laterality Date  . OVARIAN CYST REMOVAL  2002  . TUBAL LIGATION      OB History    Gravida Para Term Preterm AB Living   5 3           SAB TAB Ectopic Multiple Live Births                   Home Medications    Prior to Admission medications   Medication Sig Start Date End Date Taking? Authorizing Provider  naproxen (NAPROSYN) 500 MG tablet Take 1 tablet (500 mg total) by mouth 2 (two) times daily with a meal. 09/23/16   Arthor Captain, PA-C    Family History No family history on file.  Social History Social History  Substance Use Topics  . Smoking status: Current Some Day Smoker    Types: Cigarettes  . Smokeless tobacco: Never Used  . Alcohol use No     Allergies   Penicillins   Review of Systems Review of Systems  Ten  systems reviewed and are negative for acute change, except as noted in the HPI.   Physical Exam Updated Vital Signs BP 130/84 (BP Location: Right Arm)   Pulse 71   Temp 98.6 F (37 C) (Oral)   Resp 20   Ht  (1.6 m)   Wt 250 lb (113.4 kg)   SpO2 100%   BMI 44.29 kg/m   Physical Exam  Constitutional: She appears well-developed and well-nourished. No distress.  HENT:  Head: Normocephalic.  Eyes: Conjunctivae are normal.  Neck: Neck supple.  Cardiovascular: Normal rate and regular rhythm.   Pulmonary/Chest: Effort normal.  Abdominal: Soft.  Genitourinary:  Genitourinary Comments: Normal external female genitalia. No vaginal discharge, laceration. Walls are pink and health appearing. Copius bleeding from the cervical os. Tender in the right adnexa. No CMT.    Musculoskeletal: Normal range of motion.  Neurological: She is alert.  Skin: Skin is warm and dry.  Psychiatric: She has a normal mood and affect.  Nursing note and vitals reviewed.    ED Treatments / Results  DIAGNOSTIC STUDIES: Oxygen Saturation is 100% on RA, normal by my interpretation.    COORDINATION OF CARE: 8:08 PM- Pt advised of plan for treatment and pt agrees.  Labs (all labs ordered are listed, but only abnormal results are displayed) Labs Reviewed  WET PREP, GENITAL -  Abnormal; Notable for the following:       Result Value   Clue Cells Wet Prep HPF POC PRESENT (*)    WBC, Wet Prep HPF POC FEW (*)    All other components within normal limits  BASIC METABOLIC PANEL - Abnormal; Notable for the following:    Calcium 8.8 (*)    All other components within normal limits  CBC WITH DIFFERENTIAL/PLATELET - Abnormal; Notable for the following:    Hemoglobin 11.2 (*)    HCT 34.4 (*)    All other components within normal limits  URINALYSIS, ROUTINE W REFLEX MICROSCOPIC - Abnormal; Notable for the following:    Color, Urine RED (*)    APPearance TURBID (*)    Glucose, UA   (*)    Value: TEST NOT  REPORTED DUE TO COLOR INTERFERENCE OF URINE PIGMENT   Hgb urine dipstick   (*)    Value: TEST NOT REPORTED DUE TO COLOR INTERFERENCE OF URINE PIGMENT   Bilirubin Urine   (*)    Value: TEST NOT REPORTED DUE TO COLOR INTERFERENCE OF URINE PIGMENT   Ketones, ur   (*)    Value: TEST NOT REPORTED DUE TO COLOR INTERFERENCE OF URINE PIGMENT   Protein, ur   (*)    Value: TEST NOT REPORTED DUE TO COLOR INTERFERENCE OF URINE PIGMENT   Nitrite   (*)    Value: TEST NOT REPORTED DUE TO COLOR INTERFERENCE OF URINE PIGMENT   Leukocytes, UA   (*)    Value: TEST NOT REPORTED DUE TO COLOR INTERFERENCE OF URINE PIGMENT   All other components within normal limits  URINALYSIS, MICROSCOPIC (REFLEX) - Abnormal; Notable for the following:    Bacteria, UA FEW (*)    Squamous Epithelial / LPF 6-30 (*)    All other components within normal limits  PREGNANCY, URINE  HCG, QUANTITATIVE, PREGNANCY  ABO/RH  GC/CHLAMYDIA PROBE AMP ([place]) NOT AT [facility]    EKG  EKG Interpretation None       Radiology No results found.  Procedures Procedures (including critical care time)  Medications Ordered in ED Medications  acetaminophen (TYLENOL) tablet 1,000 mg (1,000 mg Oral Given 09/23/16 2027)     Initial Impression / Assessment and Plan / ED Course  I have reviewed the triage vital signs and the nursing notes.  Pertinent labs & imaging results that were available during my care of the patient were reviewed by me and considered in my medical decision making (see chart for details).  Clinical Course as of Sep 24 205  Thu Sep 23, 2016  2013 HCG, Beta Chain, Quant, S: <1 [AH]  2014 HCG is negative  [AH]  2018 Patient's LMP was one month ago and perhaps her previous tests were false positive. I suspect the patient is menstruating  [AH]    Clinical Course User Index [AH] Arthor Captain, PA-C    =Patients without significant abnormalities. I believe that her bleeding and pain represented menstruation.  No signs of cervical motion tenderness or adnexal fullness. I discussed return precautions with the patient included soaking through pads every 15 minutes, lightheaded, dizziness, or rubs. There is a presyncope. Patient's pain treated with Tylenol's Department with improvement. She is to follow up with her OB/GYN. Return to the emergency department sooner if her bleeding worsens.  Final Clinical Impressions(s) / ED Diagnoses   Final diagnoses:  Abnormal uterine bleeding    New Prescriptions Discharge Medication List as of 09/23/2016  9:20 PM  START taking these medications   Details  naproxen (NAPROSYN) 500 MG tablet Take 1 tablet (500 mg total) by mouth 2 (two) times daily with a meal., Starting Thu 09/23/2016, Print       I personally performed the services described in this documentation, which was scribed in my presence. The recorded information has been reviewed and is accurate.        Arthor Captain, PA-C 09/24/16 4540    Canary Brim Tegeler, MD 09/24/16 [phone]

## 2016-09-23 NOTE — Discharge Instructions (Signed)
Get help right away if: You pass out. You are changing pads every 15 to 30 minutes. You have abdominal pain. You have a fever. You become sweaty or weak. You are passing large blood clots from the vagina. You start to feel nauseous and vomit.

## 2016-09-23 NOTE — ED Triage Notes (Signed)
LMP 3/23  Pregnancy test positive   Vag bleeding started about 1 hour ago

## 2016-09-24 LAB — GC/CHLAMYDIA PROBE AMP (~~LOC~~) NOT AT ARMC
Chlamydia: NEGATIVE
Neisseria Gonorrhea: NEGATIVE

## 2017-06-28 ENCOUNTER — Other Ambulatory Visit: Payer: Self-pay

## 2017-06-28 ENCOUNTER — Inpatient Hospital Stay (HOSPITAL_BASED_OUTPATIENT_CLINIC_OR_DEPARTMENT_OTHER)
Admission: EM | Admit: 2017-06-28 | Discharge: 2017-07-04 | DRG: 418 | Disposition: A | Discharge status: Home / Self Care | Payer: Self-pay | Attending: Emergency Medicine | Admitting: Emergency Medicine

## 2017-06-28 ENCOUNTER — Encounter (HOSPITAL_BASED_OUTPATIENT_CLINIC_OR_DEPARTMENT_OTHER): Payer: Self-pay | Admitting: *Deleted

## 2017-06-28 DIAGNOSIS — R109 Unspecified abdominal pain: Secondary | ICD-10-CM

## 2017-06-28 DIAGNOSIS — Z880 Allergy status to penicillin: Secondary | ICD-10-CM

## 2017-06-28 DIAGNOSIS — K819 Cholecystitis, unspecified: Secondary | ICD-10-CM

## 2017-06-28 DIAGNOSIS — F17210 Nicotine dependence, cigarettes, uncomplicated: Secondary | ICD-10-CM | POA: Diagnosis present

## 2017-06-28 DIAGNOSIS — K567 Ileus, unspecified: Secondary | ICD-10-CM

## 2017-06-28 DIAGNOSIS — Z9851 Tubal ligation status: Secondary | ICD-10-CM

## 2017-06-28 DIAGNOSIS — Z6841 Body Mass Index (BMI) 40.0 and over, adult: Secondary | ICD-10-CM

## 2017-06-28 DIAGNOSIS — K910 Vomiting following gastrointestinal surgery: Secondary | ICD-10-CM | POA: Diagnosis not present

## 2017-06-28 DIAGNOSIS — Z791 Long term (current) use of non-steroidal anti-inflammatories (NSAID): Secondary | ICD-10-CM

## 2017-06-28 DIAGNOSIS — K8000 Calculus of gallbladder with acute cholecystitis without obstruction: Principal | ICD-10-CM | POA: Diagnosis present

## 2017-06-28 LAB — URINALYSIS, ROUTINE W REFLEX MICROSCOPIC
BILIRUBIN URINE: NEGATIVE
GLUCOSE, UA: NEGATIVE mg/dL
KETONES UR: NEGATIVE mg/dL
LEUKOCYTES UA: NEGATIVE
Nitrite: NEGATIVE
PH: 6.5 (ref 5.0–8.0)
PROTEIN: NEGATIVE mg/dL
Specific Gravity, Urine: 1.015 (ref 1.005–1.030)

## 2017-06-28 LAB — CBC WITH DIFFERENTIAL/PLATELET
Basophils Absolute: 0 10*3/uL (ref 0.0–0.1)
Basophils Relative: 0 %
Eosinophils Absolute: 0.3 10*3/uL (ref 0.0–0.7)
Eosinophils Relative: 3 %
HEMATOCRIT: 33.9 % — AB (ref 36.0–46.0)
HEMOGLOBIN: 11.2 g/dL — AB (ref 12.0–15.0)
LYMPHS ABS: 3.2 10*3/uL (ref 0.7–4.0)
LYMPHS PCT: 36 %
MCH: 28.6 pg (ref 26.0–34.0)
MCHC: 33 g/dL (ref 30.0–36.0)
MCV: 86.5 fL (ref 78.0–100.0)
MONOS PCT: 11 %
Monocytes Absolute: 1 10*3/uL (ref 0.1–1.0)
NEUTROS ABS: 4.3 10*3/uL (ref 1.7–7.7)
NEUTROS PCT: 50 %
Platelets: 292 10*3/uL (ref 150–400)
RBC: 3.92 MIL/uL (ref 3.87–5.11)
RDW: 13.7 % (ref 11.5–15.5)
WBC: 8.7 10*3/uL (ref 4.0–10.5)

## 2017-06-28 LAB — URINALYSIS, MICROSCOPIC (REFLEX): WBC UA: NONE SEEN WBC/hpf (ref 0–5)

## 2017-06-28 LAB — PREGNANCY, URINE: Preg Test, Ur: NEGATIVE

## 2017-06-28 MED ORDER — ONDANSETRON HCL 4 MG/2ML IJ SOLN
4.0000 mg | Freq: Once | INTRAMUSCULAR | Status: AC
  Administered 2017-06-28: 4 mg via INTRAVENOUS
  Filled 2017-06-28: qty 2

## 2017-06-28 MED ORDER — MORPHINE SULFATE (PF) 4 MG/ML IV SOLN
4.0000 mg | Freq: Once | INTRAVENOUS | Status: AC
  Administered 2017-06-28: 4 mg via INTRAVENOUS
  Filled 2017-06-28: qty 1

## 2017-06-28 MED ORDER — SODIUM CHLORIDE 0.9 % IV BOLUS (SEPSIS)
1000.0000 mL | Freq: Once | INTRAVENOUS | Status: AC
  Administered 2017-06-28: 1000 mL via INTRAVENOUS

## 2017-06-28 NOTE — ED Triage Notes (Addendum)
Epigastric pain and left mid abdominal pain for a week. Bloating.  Vomited x 2 today.

## 2017-06-29 ENCOUNTER — Encounter (HOSPITAL_COMMUNITY): Payer: Self-pay | Admitting: *Deleted

## 2017-06-29 ENCOUNTER — Emergency Department (HOSPITAL_COMMUNITY): Payer: Self-pay | Admitting: Certified Registered"

## 2017-06-29 ENCOUNTER — Emergency Department (HOSPITAL_BASED_OUTPATIENT_CLINIC_OR_DEPARTMENT_OTHER): Payer: Self-pay

## 2017-06-29 ENCOUNTER — Encounter (HOSPITAL_COMMUNITY): Admission: EM | Disposition: A | Discharge status: Home / Self Care | Payer: Self-pay | Source: Home / Self Care

## 2017-06-29 DIAGNOSIS — K8000 Calculus of gallbladder with acute cholecystitis without obstruction: Secondary | ICD-10-CM | POA: Diagnosis present

## 2017-06-29 HISTORY — PX: LAPAROSCOPIC CHOLECYSTECTOMY: SUR755

## 2017-06-29 HISTORY — PX: CHOLECYSTECTOMY: SHX55

## 2017-06-29 LAB — COMPREHENSIVE METABOLIC PANEL
ALT: 17 U/L (ref 14–54)
ANION GAP: 11 (ref 5–15)
AST: 24 U/L (ref 15–41)
Albumin: 4 g/dL (ref 3.5–5.0)
Alkaline Phosphatase: 56 U/L (ref 38–126)
BUN: 18 mg/dL (ref 6–20)
CHLORIDE: 102 mmol/L (ref 101–111)
CO2: 24 mmol/L (ref 22–32)
CREATININE: 0.71 mg/dL (ref 0.44–1.00)
Calcium: 8.6 mg/dL — ABNORMAL LOW (ref 8.9–10.3)
GFR calc non Af Amer: >60 mL/min (ref >60)
Glucose, Bld: 102 mg/dL — ABNORMAL HIGH (ref 65–99)
Potassium: 3.7 mmol/L (ref 3.5–5.1)
Sodium: 137 mmol/L (ref 135–145)
Total Bilirubin: 0.2 mg/dL — ABNORMAL LOW (ref 0.3–1.2)
Total Protein: 8.3 g/dL — ABNORMAL HIGH (ref 6.5–8.1)

## 2017-06-29 LAB — LIPASE, BLOOD: LIPASE: 45 U/L (ref 11–51)

## 2017-06-29 SURGERY — LAPAROSCOPIC CHOLECYSTECTOMY
Anesthesia: General | Site: Abdomen

## 2017-06-29 MED ORDER — SUCCINYLCHOLINE CHLORIDE 200 MG/10ML IV SOSY
PREFILLED_SYRINGE | INTRAVENOUS | Status: DC | PRN
  Administered 2017-06-29: 140 mg via INTRAVENOUS

## 2017-06-29 MED ORDER — BUPIVACAINE-EPINEPHRINE (PF) 0.5% -1:200000 IJ SOLN
INTRAMUSCULAR | Status: AC
  Filled 2017-06-29: qty 30

## 2017-06-29 MED ORDER — LACTATED RINGERS IV SOLN
INTRAVENOUS | Status: DC
  Administered 2017-06-29 (×2): via INTRAVENOUS

## 2017-06-29 MED ORDER — BUPIVACAINE-EPINEPHRINE 0.5% -1:200000 IJ SOLN
INTRAMUSCULAR | Status: DC | PRN
  Administered 2017-06-29: 20 mL

## 2017-06-29 MED ORDER — MIDAZOLAM HCL 2 MG/2ML IJ SOLN
INTRAMUSCULAR | Status: AC
  Filled 2017-06-29: qty 2

## 2017-06-29 MED ORDER — MORPHINE SULFATE (PF) 4 MG/ML IV SOLN
4.0000 mg | Freq: Once | INTRAVENOUS | Status: AC
  Administered 2017-06-29: 4 mg via INTRAVENOUS
  Filled 2017-06-29: qty 1

## 2017-06-29 MED ORDER — KETOROLAC TROMETHAMINE 30 MG/ML IJ SOLN
INTRAMUSCULAR | Status: AC
  Filled 2017-06-29: qty 1

## 2017-06-29 MED ORDER — SODIUM CHLORIDE 0.9 % IR SOLN
Status: DC | PRN
  Administered 2017-06-29: 1000 mL

## 2017-06-29 MED ORDER — DIPHENHYDRAMINE HCL 50 MG/ML IJ SOLN: 25.0000 mg | Freq: Four times a day (QID) | INTRAMUSCULAR | Status: DC | PRN

## 2017-06-29 MED ORDER — LIDOCAINE 2% (20 MG/ML) 5 ML SYRINGE
INTRAMUSCULAR | Status: DC | PRN
  Administered 2017-06-29: 100 mg via INTRAVENOUS

## 2017-06-29 MED ORDER — ROCURONIUM BROMIDE 10 MG/ML (PF) SYRINGE
PREFILLED_SYRINGE | INTRAVENOUS | Status: DC | PRN
  Administered 2017-06-29 (×2): 5 mg via INTRAVENOUS
  Administered 2017-06-29: 40 mg via INTRAVENOUS
  Administered 2017-06-29: 5 mg via INTRAVENOUS

## 2017-06-29 MED ORDER — DEXAMETHASONE SODIUM PHOSPHATE 10 MG/ML IJ SOLN
INTRAMUSCULAR | Status: DC | PRN
  Administered 2017-06-29: 10 mg via INTRAVENOUS

## 2017-06-29 MED ORDER — FENTANYL CITRATE (PF) 100 MCG/2ML IJ SOLN
50.0000 ug | Freq: Once | INTRAMUSCULAR | Status: AC
  Administered 2017-06-29: 50 ug via INTRAVENOUS
  Filled 2017-06-29: qty 2

## 2017-06-29 MED ORDER — SODIUM CHLORIDE 0.9 % IV SOLN
INTRAVENOUS | Status: DC
  Administered 2017-06-29 – 2017-07-03 (×3): via INTRAVENOUS

## 2017-06-29 MED ORDER — HYDRALAZINE HCL 20 MG/ML IJ SOLN: 10.0000 mg | INTRAMUSCULAR | Status: DC | PRN

## 2017-06-29 MED ORDER — DIPHENHYDRAMINE HCL 25 MG PO CAPS
25.0000 mg | ORAL_CAPSULE | Freq: Four times a day (QID) | ORAL | Status: DC | PRN
  Administered 2017-06-30: 25 mg via ORAL
  Filled 2017-06-29 (×2): qty 1

## 2017-06-29 MED ORDER — ENOXAPARIN SODIUM 40 MG/0.4ML ~~LOC~~ SOLN
40.0000 mg | SUBCUTANEOUS | Status: DC
  Administered 2017-06-30 – 2017-07-04 (×5): 40 mg via SUBCUTANEOUS
  Filled 2017-06-29 (×5): qty 0.4

## 2017-06-29 MED ORDER — TRAMADOL HCL 50 MG PO TABS
ORAL_TABLET | ORAL | Status: AC
  Filled 2017-06-29: qty 1

## 2017-06-29 MED ORDER — SUGAMMADEX SODIUM 200 MG/2ML IV SOLN
INTRAVENOUS | Status: DC | PRN
  Administered 2017-06-29: 200 mg via INTRAVENOUS

## 2017-06-29 MED ORDER — PROPOFOL 10 MG/ML IV BOLUS
INTRAVENOUS | Status: AC
  Filled 2017-06-29: qty 20

## 2017-06-29 MED ORDER — ONDANSETRON HCL 4 MG/2ML IJ SOLN
4.0000 mg | Freq: Once | INTRAMUSCULAR | Status: AC
  Administered 2017-06-29: 4 mg via INTRAVENOUS
  Filled 2017-06-29: qty 2

## 2017-06-29 MED ORDER — ONDANSETRON HCL 4 MG/2ML IJ SOLN
INTRAMUSCULAR | Status: DC | PRN
  Administered 2017-06-29: 4 mg via INTRAVENOUS

## 2017-06-29 MED ORDER — GABAPENTIN 300 MG PO CAPS
300.0000 mg | ORAL_CAPSULE | Freq: Two times a day (BID) | ORAL | Status: DC
  Administered 2017-06-29 – 2017-07-01 (×4): 300 mg via ORAL
  Filled 2017-06-29 (×4): qty 1

## 2017-06-29 MED ORDER — CIPROFLOXACIN IN D5W 400 MG/200ML IV SOLN
400.0000 mg | Freq: Once | INTRAVENOUS | Status: AC
  Administered 2017-06-29: 400 mg via INTRAVENOUS
  Filled 2017-06-29: qty 200

## 2017-06-29 MED ORDER — 0.9 % SODIUM CHLORIDE (POUR BTL) OPTIME
TOPICAL | Status: DC | PRN
  Administered 2017-06-29: 1000 mL

## 2017-06-29 MED ORDER — KETOROLAC TROMETHAMINE 30 MG/ML IJ SOLN
INTRAMUSCULAR | Status: AC
  Administered 2017-06-29: 30 mg via INTRAVENOUS
  Filled 2017-06-29: qty 1

## 2017-06-29 MED ORDER — ONDANSETRON 4 MG PO TBDP
4.0000 mg | ORAL_TABLET | Freq: Four times a day (QID) | ORAL | Status: DC | PRN
  Filled 2017-06-29: qty 1

## 2017-06-29 MED ORDER — MIDAZOLAM HCL 5 MG/5ML IJ SOLN
INTRAMUSCULAR | Status: DC | PRN
  Administered 2017-06-29: 2 mg via INTRAVENOUS

## 2017-06-29 MED ORDER — ONDANSETRON HCL 4 MG/2ML IJ SOLN
4.0000 mg | Freq: Four times a day (QID) | INTRAMUSCULAR | Status: DC | PRN
  Administered 2017-06-30 – 2017-07-01 (×2): 4 mg via INTRAVENOUS
  Filled 2017-06-29 (×2): qty 2

## 2017-06-29 MED ORDER — VASOPRESSIN 20 UNIT/ML IV SOLN
INTRAVENOUS | Status: AC
  Filled 2017-06-29: qty 1

## 2017-06-29 MED ORDER — FENTANYL CITRATE (PF) 100 MCG/2ML IJ SOLN
INTRAMUSCULAR | Status: AC
  Filled 2017-06-29: qty 2

## 2017-06-29 MED ORDER — KETOROLAC TROMETHAMINE 30 MG/ML IJ SOLN
30.0000 mg | Freq: Four times a day (QID) | INTRAMUSCULAR | Status: DC | PRN
  Administered 2017-06-29 – 2017-06-30 (×2): 30 mg via INTRAVENOUS
  Filled 2017-06-29: qty 1

## 2017-06-29 MED ORDER — PROPOFOL 10 MG/ML IV BOLUS
INTRAVENOUS | Status: DC | PRN
  Administered 2017-06-29: 200 mg via INTRAVENOUS

## 2017-06-29 MED ORDER — HYDROCODONE-ACETAMINOPHEN 5-325 MG PO TABS
1.0000 | ORAL_TABLET | ORAL | Status: DC | PRN
  Administered 2017-06-29 – 2017-07-01 (×5): 2 via ORAL
  Filled 2017-06-29 (×6): qty 2

## 2017-06-29 MED ORDER — FENTANYL CITRATE (PF) 100 MCG/2ML IJ SOLN
INTRAMUSCULAR | Status: DC | PRN
  Administered 2017-06-29: 100 ug via INTRAVENOUS
  Administered 2017-06-29: 50 ug via INTRAVENOUS
  Administered 2017-06-29: 25 ug via INTRAVENOUS

## 2017-06-29 MED ORDER — MORPHINE SULFATE (PF) 2 MG/ML IV SOLN
2.0000 mg | INTRAVENOUS | Status: DC | PRN
  Administered 2017-06-29: 2 mg via INTRAVENOUS
  Filled 2017-06-29: qty 1

## 2017-06-29 MED ORDER — PROMETHAZINE HCL 25 MG/ML IJ SOLN: 6.2500 mg | INTRAMUSCULAR | Status: DC | PRN

## 2017-06-29 MED ORDER — FENTANYL CITRATE (PF) 100 MCG/2ML IJ SOLN
25.0000 ug | INTRAMUSCULAR | Status: DC | PRN
  Administered 2017-06-29 (×3): 50 ug via INTRAVENOUS

## 2017-06-29 MED ORDER — SCOPOLAMINE 1 MG/3DAYS TD PT72
1.0000 | MEDICATED_PATCH | TRANSDERMAL | Status: DC
  Administered 2017-06-29: 1 via TRANSDERMAL

## 2017-06-29 MED ORDER — TRAMADOL HCL 50 MG PO TABS
50.0000 mg | ORAL_TABLET | Freq: Four times a day (QID) | ORAL | Status: DC | PRN
  Administered 2017-06-29: 50 mg via ORAL

## 2017-06-29 MED ORDER — FENTANYL CITRATE (PF) 250 MCG/5ML IJ SOLN
INTRAMUSCULAR | Status: AC
  Filled 2017-06-29: qty 5

## 2017-06-29 SURGICAL SUPPLY — 37 items
BLADE CLIPPER SURG (BLADE) IMPLANT
CANISTER SUCT 3000ML PPV (MISCELLANEOUS) ×3 IMPLANT
CHLORAPREP W/TINT 26ML (MISCELLANEOUS) ×3 IMPLANT
CLIP VESOLOCK XL 6/CT (CLIP) ×3 IMPLANT
COVER SURGICAL LIGHT HANDLE (MISCELLANEOUS) ×3 IMPLANT
DERMABOND ADVANCED (GAUZE/BANDAGES/DRESSINGS) ×2
DERMABOND ADVANCED .7 DNX12 (GAUZE/BANDAGES/DRESSINGS) ×1 IMPLANT
ELECT REM PT RETURN 9FT ADLT (ELECTROSURGICAL) ×3
ELECTRODE REM PT RTRN 9FT ADLT (ELECTROSURGICAL) ×1 IMPLANT
GLOVE BIOGEL PI IND STRL 7.0 (GLOVE) ×2 IMPLANT
GLOVE BIOGEL PI INDICATOR 7.0 (GLOVE) ×4
GLOVE ECLIPSE 8.0 STRL XLNG CF (GLOVE) ×3 IMPLANT
GLOVE SURG SS PI 6.5 STRL IVOR (GLOVE) ×3 IMPLANT
GLOVE SURG SS PI 7.0 STRL IVOR (GLOVE) ×3 IMPLANT
GOWN STRL REUS W/ TWL LRG LVL3 (GOWN DISPOSABLE) ×3 IMPLANT
GOWN STRL REUS W/TWL LRG LVL3 (GOWN DISPOSABLE) ×6
GRASPER SUT TROCAR 14GX15 (MISCELLANEOUS) ×3 IMPLANT
KIT BASIN OR (CUSTOM PROCEDURE TRAY) ×3 IMPLANT
KIT ROOM TURNOVER OR (KITS) ×3 IMPLANT
NEEDLE 22X1 1/2 (OR ONLY) (NEEDLE) ×3 IMPLANT
NS IRRIG 1000ML POUR BTL (IV SOLUTION) ×3 IMPLANT
PAD ARMBOARD 7.5X6 YLW CONV (MISCELLANEOUS) ×3 IMPLANT
POUCH RETRIEVAL ECOSAC 10 (ENDOMECHANICALS) ×1 IMPLANT
POUCH RETRIEVAL ECOSAC 10MM (ENDOMECHANICALS) ×2
SCISSORS LAP 5X35 DISP (ENDOMECHANICALS) ×3 IMPLANT
SET IRRIG TUBING LAPAROSCOPIC (IRRIGATION / IRRIGATOR) ×3 IMPLANT
SLEEVE ENDOPATH XCEL 5M (ENDOMECHANICALS) ×6 IMPLANT
SPECIMEN JAR SMALL (MISCELLANEOUS) ×3 IMPLANT
SUT MNCRL AB 4-0 PS2 18 (SUTURE) ×3 IMPLANT
SUT VICRYL 0 UR6 27IN ABS (SUTURE) ×3 IMPLANT
TOWEL OR 17X24 6PK STRL BLUE (TOWEL DISPOSABLE) ×3 IMPLANT
TOWEL OR 17X26 10 PK STRL BLUE (TOWEL DISPOSABLE) ×3 IMPLANT
TRAY LAPAROSCOPIC MC (CUSTOM PROCEDURE TRAY) ×3 IMPLANT
TROCAR XCEL 12X100 BLDLESS (ENDOMECHANICALS) ×3 IMPLANT
TROCAR XCEL NON-BLD 5MMX100MML (ENDOMECHANICALS) ×3 IMPLANT
TUBING INSUFFLATION (TUBING) ×3 IMPLANT
WATER STERILE IRR 1000ML POUR (IV SOLUTION) ×3 IMPLANT

## 2017-06-29 NOTE — ED Notes (Signed)
[facility]. Pt sleepy and in NAD on arrival.

## 2017-06-29 NOTE — Anesthesia Postprocedure Evaluation (Signed)
Anesthesia Post Note  Patient: Breanna English  Procedure(s) Performed: LAPAROSCOPIC CHOLECYSTECTOMY (N/A Abdomen)     Patient location during evaluation: PACU Anesthesia Type: General Level of consciousness: sedated Pain management: pain level controlled Vital Signs Assessment: post-procedure vital signs reviewed and stable Respiratory status: spontaneous breathing and respiratory function stable Cardiovascular status: stable Postop Assessment: no apparent nausea or vomiting Anesthetic complications: no    Last Vitals:  Vitals:   06/29/17 1100 06/29/17 1115  BP: 122/82   Pulse: (!) 58 (!) 57  Resp: 11 11  Temp:    SpO2: 100% 100%    Last Pain:  Vitals:   06/29/17 1115  TempSrc:   PainSc: Asleep                 Ata Pecha DANIEL

## 2017-06-29 NOTE — Transfer of Care (Signed)
Immediate Anesthesia Transfer of Care Note  Patient: Breanna SlatesGrace English  Procedure(s) Performed: LAPAROSCOPIC CHOLECYSTECTOMY (N/A Abdomen)  Patient Location: PACU  Anesthesia Type:General  Level of Consciousness: awake and alert   Airway & Oxygen Therapy: Patient Spontanous Breathing  Post-op Assessment: Report given to RN and Post -op Vital signs reviewed and stable  Post vital signs: Reviewed and stable  Last Vitals:  Vitals:   06/29/17 0645 06/29/17 0800  BP: 112/67 106/68  Pulse: 60 64  Resp:    Temp:    SpO2: 97% 98%    Last Pain:  Vitals:   06/29/17 0803  TempSrc:   PainSc: 0-No pain         Complications: No apparent anesthesia complications

## 2017-06-29 NOTE — Anesthesia Preprocedure Evaluation (Signed)
Anesthesia Evaluation  Patient identified by MRN, date of birth, ID band Patient awake    Reviewed: Allergy & Precautions, NPO status , Patient's Chart, lab work & pertinent test results  Airway Mallampati: II  TM Distance: >3 FB Neck ROM: Full    Dental no notable dental hx. (+) Dental Advisory Given   Pulmonary Current Smoker,    Pulmonary exam normal        Cardiovascular negative cardio ROS Normal cardiovascular exam     Neuro/Psych negative neurological ROS  negative psych ROS   GI/Hepatic negative GI ROS, Neg liver ROS,   Endo/Other  Morbid obesity  Renal/GU negative Renal ROS  negative genitourinary   Musculoskeletal negative musculoskeletal ROS (+)   Abdominal   Peds negative pediatric ROS (+)  Hematology negative hematology ROS (+)   Anesthesia Other Findings   Reproductive/Obstetrics negative OB ROS                             Anesthesia Physical Anesthesia Plan  ASA: III and emergent  Anesthesia Plan: General   Post-op Pain Management:    Induction: Intravenous  PONV Risk Score and Plan: 3 and Ondansetron, Dexamethasone and Scopolamine patch - Pre-op  Airway Management Planned: Oral ETT  Additional Equipment:   Intra-op Plan:   Post-operative Plan: Extubation in OR  Informed Consent: I have reviewed the patients History and Physical, chart, labs and discussed the procedure including the risks, benefits and alternatives for the proposed anesthesia with the patient or authorized representative who has indicated his/her understanding and acceptance.   Dental advisory given  Plan Discussed with: CRNA, Anesthesiologist and Surgeon  Anesthesia Plan Comments:         Anesthesia Quick Evaluation

## 2017-06-29 NOTE — Anesthesia Procedure Notes (Signed)
Procedure Name: Intubation Date/Time: 06/29/2017 8:48 AM Performed by: Elliot DallyHuggins, Danyon Mcginness, CRNA Pre-anesthesia Checklist: Patient identified, Emergency Drugs available, Suction available and Patient being monitored Patient Re-evaluated:Patient Re-evaluated prior to induction Oxygen Delivery Method: Circle System Utilized Preoxygenation: Pre-oxygenation with 100% oxygen Induction Type: IV induction and Rapid sequence Laryngoscope Size: Miller and 2 Grade View: Grade I Tube type: Oral Tube size: 7.0 mm Number of attempts: 1 Airway Equipment and Method: Stylet and Oral airway Placement Confirmation: ETT inserted through vocal cords under direct vision,  positive ETCO2 and breath sounds checked- equal and bilateral Secured at: 21 cm Tube secured with: Tape Dental Injury: Teeth and Oropharynx as per pre-operative assessment

## 2017-06-29 NOTE — ED Provider Notes (Signed)
[facility] EMERGENCY DEPARTMENT Provider Note   CSN: [id number] Arrival date & time: 06/28/17  2052     History   Chief Complaint Chief Complaint  Patient presents with  . Abdominal Pain    HPI Breanna English is a 32 y.o. female.  HPI  This is a 32 year old female who presents with abdominal pain.  Presents with 1 week history of worsening epigastric and right upper quadrant abdominal pain.  She states that it is worse after eating.  She describes it as sharp and nonradiating.  Current pain is rated 8 out of 10.  She has not taken anything for her pain.  She had several episodes of nonbilious, nonbloody emesis today.  Normal bowel movements.  Denies any urinary symptoms including dysuria or hematuria.  Denies any fevers.  Past Medical History:  Diagnosis Date  . Ovarian cyst     There are no active problems to display for this patient.   Past Surgical History:  Procedure Laterality Date  . OVARIAN CYST REMOVAL  2002  . TUBAL LIGATION      OB History    Gravida Para Term Preterm AB Living   5 3           SAB TAB Ectopic Multiple Live Births                   Home Medications    Prior to Admission medications   Medication Sig Start Date End Date Taking? Authorizing Provider  naproxen (NAPROSYN) 500 MG tablet Take 1 tablet (500 mg total) by mouth 2 (two) times daily with a meal. 09/23/16   Arthor CaptainHarris, Abigail, PA-C    Family History No family history on file.  Social History Social History   Tobacco Use  . Smoking status: Current Some Day Smoker    Types: Cigarettes  . Smokeless tobacco: Never Used  Substance Use Topics  . Alcohol use: No  . Drug use: No     Allergies   Penicillins   Review of Systems Review of Systems  Constitutional: Negative for fever.  Respiratory: Negative for shortness of breath.   Cardiovascular: Negative for chest pain.  Gastrointestinal: Positive for abdominal pain, nausea and vomiting. Negative for diarrhea.    Genitourinary: Negative for dysuria and hematuria.  All other systems reviewed and are negative.    Physical Exam Updated Vital Signs BP 108/63 (BP Location: Left Arm)   Pulse 72   Temp 98 F (36.7 C) (Oral)   Resp 18   Ht 5\' 3"  (1.6 m)   Wt 114.3 kg (252 lb)   SpO2 100%   BMI 44.64 kg/m   Physical Exam  Constitutional: She is oriented to person, place, and time. She appears well-developed and well-nourished.  Obese, no acute distress  HENT:  Head: Normocephalic and atraumatic.  Neck: Neck supple.  Cardiovascular: Normal rate, regular rhythm and normal heart sounds.  Pulmonary/Chest: Effort normal. No respiratory distress. She has no wheezes.  Abdominal: Soft. Bowel sounds are normal. There is tenderness in the right upper quadrant and epigastric area. There is positive Murphy's sign. There is no rebound and no guarding.  Neurological: She is alert and oriented to person, place, and time.  Skin: Skin is warm and dry.  Psychiatric: She has a normal mood and affect.  Nursing note and vitals reviewed.    ED Treatments / Results  Labs (all labs ordered are listed, but only abnormal results are displayed) Labs Reviewed  URINALYSIS, ROUTINE W REFLEX  MICROSCOPIC - Abnormal; Notable for the following components:      Result Value   Hgb urine dipstick TRACE (*)    All other components within normal limits  URINALYSIS, MICROSCOPIC (REFLEX) - Abnormal; Notable for the following components:   Bacteria, UA RARE (*)    Squamous Epithelial / LPF 0-5 (*)    All other components within normal limits  CBC WITH DIFFERENTIAL/PLATELET - Abnormal; Notable for the following components:   Hemoglobin 11.2 (*)    HCT 33.9 (*)    All other components within normal limits  COMPREHENSIVE METABOLIC PANEL - Abnormal; Notable for the following components:   Glucose, Bld 102 (*)    Calcium 8.6 (*)    Total Protein 8.3 (*)    Total Bilirubin 0.2 (*)    All other components within normal limits   PREGNANCY, URINE  LIPASE, BLOOD    EKG  EKG Interpretation None       Radiology US Abdomen Limited Ruq  Result Date: 06/29/2017 CLINICAL DATA:  32 y/o F; epigastric and right upper quadrant pain for 10-12 hours. EXAM: ULTRASOUND ABDOMEN LIMITED RIGHT UPPER QUADRANT COMPARISON:  09/30/2014 CT abdomen and pelvis FINDINGS: Gallbladder: Wall echo shadow sign, poor visualization of the gallbladder. Positive sonographic Murphy's sign. Pericholecystic fluid. Common bile duct: Diameter: 2.4 mm Liver: 1.6 cm echogenic focus, likely hemangioma. Within normal limits in parenchymal echogenicity. Portal vein is patent on color Doppler imaging with normal direction of blood flow towards the liver. IMPRESSION: 1. Wall echo shadow sign, likely multiple gallstones. Positive sonographic Murphy's sign. Pericholecystic fluid. Findings are compatible with acute cholecystitis in the appropriate clinical setting. 2. 1.6 cm echogenic focus in the liver, likely hemangioma. Electronically Signed   By: Mitzi Hansen M.D.   On: 06/29/2017 00:56    Procedures Procedures (including critical care time)  Medications Ordered in ED Medications  ciprofloxacin (CIPRO) IVPB 400 mg (not administered)  sodium chloride 0.9 % bolus 1,000 mL (0 mLs Intravenous Stopped 06/29/17 0034)  morphine 4 MG/ML injection 4 mg (4 mg Intravenous Given 06/28/17 2340)  ondansetron (ZOFRAN) injection 4 mg (4 mg Intravenous Given 06/28/17 2340)  morphine 4 MG/ML injection 4 mg (4 mg Intravenous Given 06/29/17 0201)     Initial Impression / Assessment and Plan / ED Course  I have reviewed the triage vital signs and the nursing notes.  Pertinent labs & imaging results that were available during my care of the patient were reviewed by me and considered in my medical decision making (see chart for details).     Patient presents with abdominal pain.  It is postprandial.  She is uncomfortable appearing on exam but nontoxic and vital  signs are reassuring.  She has a positive Murphy sign without signs of peritonitis on exam.  Suspect gallbladder pathology.  Workup including labs started.  Lab workup is largely reassuring.  However, ultrasound shows pericholecystic fluid concerning for acute cholecystitis.  Patient has ongoing pain in his required multiple doses of pain medication.  Patient was given IV ciprofloxacin as she has a penicillin allergy.  Discussed with Dr. Magnus Ivan, general surgery.  Recommends ED to ED transfer for general surgery evaluation.  Final Clinical Impressions(s) / ED Diagnoses   Final diagnoses:  Abdominal pain  Cholecystitis    ED Discharge Orders    None       Janette Harvie, Mayer Masker, MD 06/29/17 [phone]

## 2017-06-29 NOTE — ED Provider Notes (Signed)
[facility] to see general surgery for cholecystitis.  She is in no distress.  Abdomen is soft with mild right upper quadrant and epigastric tenderness.  Patient stable on arrival.  Mild abdominal tenderness on exam without peritoneal signs.  She declines pain or nausea medication.  Discussed with Dr. Magnus IvanBlackman on arrival.  Surgery will evaluate.   Glynn Octaveancour, Mekenzie Modeste, MD 06/29/17 [id number]

## 2017-06-29 NOTE — ED Notes (Signed)
Called [facility] for transport to [place] ED.  

## 2017-06-29 NOTE — Op Note (Signed)
PATIENT:  Breanna English  32 y.o. female  PRE-OPERATIVE DIAGNOSIS:  Gallstones  POST-OPERATIVE DIAGNOSIS:  Gallstones  PROCEDURE:  Procedure(s): LAPAROSCOPIC CHOLECYSTECTOMY   SURGEON:  Surgeon(s): Arius Harnois, De BlanchLuke Aaron, MD  ASSISTANT: none  ANESTHESIA:   local and general  Indications for procedure: Breanna SlatesGrace Norberto is a 32 y.o. female with symptoms of Abdominal pain and Nausea and vomiting consistent with gallbladder disease, Confirmed by Ultrasound.  Description of procedure: The patient was brought into the operative suite, placed supine. Anesthesia was administered with endotracheal tube. Patient was strapped in place and foot board was secured. All pressure points were offloaded by foam padding. The patient was prepped and draped in the usual sterile fashion.  A small incision was made to the right of the umbilicus. A 5mm trocar was inserted into the peritoneal cavity with optical entry. Pneumoperitoneum was applied with high flow low pressure. 2 5mm trocars were placed in the RUQ. A 12mm trocar was placed in the subxiphoid space. All trocars sites were first anesthesized with marcaine with epinephrine in the subcutaneous and preperitoneal layers. Next the patient was placed in reverse trendelenberg. The gallbladder was distended with multiple visible large stones and edematous  The gallbladder was retracted cephalad and lateral. The peritoneum was reflected off the infundibulum working lateral to medial. The cystic duct and cystic artery were identified and further dissection revealed a critical view.  The cystic duct and cystic artery were doubly clipped and ligated.   The gallbladder was removed off the liver bed with cautery. The Gallbladder was placed in a specimen bag. The gallbladder fossa was irrigated and hemostasis was applied with cautery. The gallbladder was removed via the 12mm trocar. The fascial defect was closed with interrupted 0 vicryl suture via laparoscopic  trans-fascial suture passer. Pneumoperitoneum was removed, all trocar were removed. All incisions were closed with 4-0 monocryl subcuticular stitch. The patient woke from anesthesia and was brought to PACU in stable condition. All counts were correct  Findings: many >1cm stones and large >2cm stone, normal ductal anatomy  Specimen: gallbladder  Blood loss: No intake/output data recorded. ml  Local anesthesia: 20 ml marcaine  Complications: none  PLAN OF CARE: Admit for overnight observation  PATIENT DISPOSITION:  PACU - hemodynamically stable.  Feliciana RossettiLuke Zayed Griffie, M.D. General, Bariatric, & Minimally Invasive Surgery [facility]

## 2017-06-29 NOTE — Progress Notes (Signed)
  Progress Note: General Surgery Service   Assessment/Plan: There are no active problems to display for this patient.  s/p Procedure(s): LAPAROSCOPIC CHOLECYSTECTOMY 06/29/2017 Abdominal pain consistent with chronic cholecystitis -lap chole today We discussed the etiology of her pain, we discussed treatment options and recommended surgery. We discussed details of surgery including general anesthesia, laparoscopic approach, identification of cystic duct and common bile duct. Ligation of cystic duct and cystic artery. Possible need for intraoperative cholangiogram or open procedure. Possible risks of common bile duct injury, liver injury, cystic duct leak, bleeding, infection, post-cholecystectomy syndrome. The patient showed good understanding and all questions were answered     LOS: 0 days  Chief Complaint/Subjective: Pain in right side for past day with nausea and chills  Objective: Vital signs in last 24 hours: Temp:  [97.5 F (36.4 C)-99 F (37.2 C)] 98 F (36.7 C) (01/30 0639) Pulse Rate:  [60-85] 60 (01/30 0645) Resp:  [18] 18 (01/30 0639) BP: (108-120)/(63-88) 112/67 (01/30 0645) SpO2:  [97 %-100 %] 97 % (01/30 0645) Weight:  [114.3 kg (252 lb)] 114.3 kg (252 lb) (01/29 2100)    Intake/Output from previous day: 01/29 0701 - 01/30 0700 In: 1000 [IV Piggyback:1000] Out: -  Intake/Output this shift: No intake/output data recorded.  Lungs: CTAB  Cardiovascular: RRR  Abd: soft, TTP RUQ  Extremities: no edema  Neuro: AOx4  Lab Results: CBC  Recent Labs    06/28/17 2335  WBC 8.7  HGB 11.2*  HCT 33.9*  PLT 292   BMET Recent Labs    06/28/17 2335  NA 137  K 3.7  CL 102  CO2 24  GLUCOSE 102*  BUN 18  CREATININE 0.71  CALCIUM 8.6*   PT/INR No results for input(s): LABPROT, INR in the last 72 hours. ABG No results for input(s): PHART, HCO3 in the last 72 hours.  Invalid input(s): PCO2, PO2  Studies/Results:  Anti-infectives: Anti-infectives  (From admission, onward)   Start     Dose/Rate Route Frequency Ordered Stop   06/29/17 0215  ciprofloxacin (CIPRO) IVPB 400 mg     400 mg 200 mL/hr over 60 Minutes Intravenous  Once 06/29/17 0214 06/29/17 0537      Medications: Scheduled Meds: Continuous Infusions: PRN Meds:.  Rodman PickleLuke Aaron Bellamarie Pflug, MD Pg# [phone] [facility]

## 2017-06-29 NOTE — H&P (Signed)
Breanna English is an 32 y.o. female.   Chief Complaint: abdominal pain HPI: 32 yo female with 1 day of RUQ/epigastric pain. Pain has been constant and kept her from sleeping. She denies strange or new foods. She has not been able to eat the last day. She has nausea and vomiting. She has chills but no fevers. No diarrhea  Past Medical History:  Diagnosis Date  . Ovarian cyst     Past Surgical History:  Procedure Laterality Date  . OVARIAN CYST REMOVAL  2002  . TUBAL LIGATION      No family history on file. Social History:  reports that she has been smoking cigarettes.  she has never used smokeless tobacco. She reports that she does not drink alcohol or use drugs.  Allergies:  Allergies  Allergen Reactions  . Penicillins Anaphylaxis    seizure     (Not in a hospital admission)  Results for orders placed or performed during the hospital encounter of 06/28/17 (from the past 48 hour(s))  Urinalysis, Routine w reflex microscopic     Status: Abnormal   Collection Time: 06/28/17  9:03 PM  Result Value Ref Range   Color, Urine YELLOW YELLOW   APPearance CLEAR CLEAR   Specific Gravity, Urine 1.015 1.005 - 1.030   pH 6.5 5.0 - 8.0   Glucose, UA NEGATIVE NEGATIVE mg/dL   Hgb urine dipstick TRACE (A) NEGATIVE   Bilirubin Urine NEGATIVE NEGATIVE   Ketones, ur NEGATIVE NEGATIVE mg/dL   Protein, ur NEGATIVE NEGATIVE mg/dL   Nitrite NEGATIVE NEGATIVE   Leukocytes, UA NEGATIVE NEGATIVE  Pregnancy, urine     Status: None   Collection Time: 06/28/17  9:03 PM  Result Value Ref Range   Preg Test, Ur NEGATIVE NEGATIVE    Comment:        THE SENSITIVITY OF THIS METHODOLOGY IS >20 mIU/mL.   Urinalysis, Microscopic (reflex)     Status: Abnormal   Collection Time: 06/28/17  9:03 PM  Result Value Ref Range   RBC / HPF 0-5 0 - 5 RBC/hpf   WBC, UA NONE SEEN 0 - 5 WBC/hpf   Bacteria, UA RARE (A) NONE SEEN   Squamous Epithelial / LPF 0-5 (A) NONE SEEN  CBC with Differential     Status:  Abnormal   Collection Time: 06/28/17 11:35 PM  Result Value Ref Range   WBC 8.7 4.0 - 10.5 K/uL   RBC 3.92 3.87 - 5.11 MIL/uL   Hemoglobin 11.2 (L) 12.0 - 15.0 g/dL   HCT 33.9 (L) 36.0 - 46.0 %   MCV 86.5 78.0 - 100.0 fL   MCH 28.6 26.0 - 34.0 pg   MCHC 33.0 30.0 - 36.0 g/dL   RDW 13.7 11.5 - 15.5 %   Platelets 292 150 - 400 K/uL   Neutrophils Relative % 50 %   Neutro Abs 4.3 1.7 - 7.7 K/uL   Lymphocytes Relative 36 %   Lymphs Abs 3.2 0.7 - 4.0 K/uL   Monocytes Relative 11 %   Monocytes Absolute 1.0 0.1 - 1.0 K/uL   Eosinophils Relative 3 %   Eosinophils Absolute 0.3 0.0 - 0.7 K/uL   Basophils Relative 0 %   Basophils Absolute 0.0 0.0 - 0.1 K/uL  Comprehensive metabolic panel     Status: Abnormal   Collection Time: 06/28/17 11:35 PM  Result Value Ref Range   Sodium 137 135 - 145 mmol/L   Potassium 3.7 3.5 - 5.1 mmol/L   Chloride 102 101 - 111  mmol/L   CO2 24 22 - 32 mmol/L   Glucose, Bld 102 (H) 65 - 99 mg/dL   BUN 18 6 - 20 mg/dL   Creatinine, Ser 0.71 0.44 - 1.00 mg/dL   Calcium 8.6 (L) 8.9 - 10.3 mg/dL   Total Protein 8.3 (H) 6.5 - 8.1 g/dL   Albumin 4.0 3.5 - 5.0 g/dL   AST 24 15 - 41 U/L   ALT 17 14 - 54 U/L   Alkaline Phosphatase 56 38 - 126 U/L   Total Bilirubin 0.2 (L) 0.3 - 1.2 mg/dL   GFR calc non Af Amer >60 >60 mL/min   GFR calc Af Amer >60 >60 mL/min    Comment: (NOTE) The eGFR has been calculated using the CKD EPI equation. This calculation has not been validated in all clinical situations. eGFR's persistently <60 mL/min signify possible Chronic Kidney Disease.    Anion gap 11 5 - 15  Lipase, blood     Status: None   Collection Time: 06/28/17 11:35 PM  Result Value Ref Range   Lipase 45 11 - 51 U/L   US Abdomen Limited Ruq  Result Date: 06/29/2017 CLINICAL DATA:  32 y/o F; epigastric and right upper quadrant pain for 10-12 hours. EXAM: ULTRASOUND ABDOMEN LIMITED RIGHT UPPER QUADRANT COMPARISON:  09/30/2014 CT abdomen and pelvis FINDINGS:  Gallbladder: Wall echo shadow sign, poor visualization of the gallbladder. Positive sonographic Murphy's sign. Pericholecystic fluid. Common bile duct: Diameter: 2.4 mm Liver: 1.6 cm echogenic focus, likely hemangioma. Within normal limits in parenchymal echogenicity. Portal vein is patent on color Doppler imaging with normal direction of blood flow towards the liver. IMPRESSION: 1. Wall echo shadow sign, likely multiple gallstones. Positive sonographic Murphy's sign. Pericholecystic fluid. Findings are compatible with acute cholecystitis in the appropriate clinical setting. 2. 1.6 cm echogenic focus in the liver, likely hemangioma. Electronically Signed   By: Kristine Garbe M.D.   On: 06/29/2017 00:56    Review of Systems  Constitutional: Negative for chills and fever.  HENT: Negative for hearing loss.   Eyes: Negative for blurred vision and double vision.  Respiratory: Negative for cough and hemoptysis.   Cardiovascular: Negative for chest pain and palpitations.  Gastrointestinal: Positive for abdominal pain, nausea and vomiting.  Genitourinary: Negative for dysuria and urgency.  Musculoskeletal: Negative for myalgias and neck pain.  Skin: Negative for itching and rash.  Neurological: Negative for dizziness, tingling and headaches.  Endo/Heme/Allergies: Does not bruise/bleed easily.  Psychiatric/Behavioral: Negative for depression and suicidal ideas.    Blood pressure 112/67, pulse 60, temperature 98 F (36.7 C), temperature source Oral, resp. rate 18, height 5' 3"  (1.6 m), weight 114.3 kg (252 lb), SpO2 97 %, unknown if currently breastfeeding. Physical Exam  Vitals reviewed. Constitutional: She is oriented to person, place, and time. She appears well-developed and well-nourished.  HENT:  Head: Normocephalic and atraumatic.  Eyes: Conjunctivae and EOM are normal. Pupils are equal, round, and reactive to light.  Neck: Normal range of motion. Neck supple.  Cardiovascular: Normal  rate and regular rhythm.  Respiratory: Effort normal and breath sounds normal.  GI: Soft. Bowel sounds are normal. She exhibits no distension. There is tenderness in the right upper quadrant.  Musculoskeletal: Normal range of motion.  Neurological: She is alert and oriented to person, place, and time.  Skin: Skin is warm and dry.  Psychiatric: She has a normal mood and affect. Her behavior is normal.     Assessment/Plan 32 yo female with abdominal pain and  nausea consistent with chronic cholecystitis -lap chole today -IV abx -We discussed the etiology of her pain, we discussed treatment options and recommended surgery. We discussed details of surgery including general anesthesia, laparoscopic approach, identification of cystic duct and common bile duct. Ligation of cystic duct and cystic artery. Possible need for intraoperative cholangiogram or open procedure. Possible risks of common bile duct injury, liver injury, cystic duct leak, bleeding, infection, post-cholecystectomy syndrome. The patient showed good understanding and all questions were answered   Mickeal Skinner, MD 06/29/2017, 7:50 AM

## 2017-06-29 NOTE — ED Notes (Signed)
Paged General Surgery (Dr. Magnus IvanBlackman) for consult.

## 2017-06-30 ENCOUNTER — Encounter (HOSPITAL_COMMUNITY): Payer: Self-pay | Admitting: General Surgery

## 2017-06-30 NOTE — Plan of Care (Signed)
  Progressing Education: Knowledge of General Education information will improve 06/30/2017 1009 - Progressing by Darreld Mcleanox, Nidya Bouyer, RN Health Behavior/Discharge Planning: Ability to manage health-related needs will improve 06/30/2017 1009 - Progressing by Darreld Mcleanox, Audra Bellard, RN Clinical Measurements: Ability to maintain clinical measurements within normal limits will improve 06/30/2017 1009 - Progressing by Darreld Mcleanox, Trinda Harlacher, RN Will remain free from infection 06/30/2017 1009 - Progressing by Darreld Mcleanox, Kyrie Fludd, RN Diagnostic test results will improve 06/30/2017 1009 - Progressing by Darreld Mcleanox, Nashid Pellum, RN Respiratory complications will improve 06/30/2017 1009 - Progressing by Darreld Mcleanox, Anahli Arvanitis, RN Cardiovascular complication will be avoided 06/30/2017 1009 - Progressing by Darreld Mcleanox, Madeline Bebout, RN Activity: Risk for activity intolerance will decrease 06/30/2017 1009 - Progressing by Darreld Mcleanox, Meesha Sek, RN Nutrition: Adequate nutrition will be maintained 06/30/2017 1009 - Progressing by Darreld Mcleanox, Fateh Kindle, RN Coping: Level of anxiety will decrease 06/30/2017 1009 - Progressing by Darreld Mcleanox, Carman Auxier, RN Elimination: Will not experience complications related to bowel motility 06/30/2017 1009 - Progressing by Darreld Mcleanox, Grant Henkes, RN Will not experience complications related to urinary retention 06/30/2017 1009 - Progressing by Darreld Mcleanox, Bevan Vu, RN Pain Managment: General experience of comfort will improve 06/30/2017 1009 - Progressing by Darreld Mcleanox, Tamira Ryland, RN Safety: Ability to remain free from injury will improve 06/30/2017 1009 - Progressing by Darreld Mcleanox, Jeniffer Culliver, RN Skin Integrity: Risk for impaired skin integrity will decrease 06/30/2017 1009 - Progressing by Darreld Mcleanox, Draden Cottingham, RN

## 2017-06-30 NOTE — Progress Notes (Signed)
[facility] Progress Note  1 Day Post-Op  Subjective: CC: nausea Patient states she threw up all her food last night. Just had breakfast and no further emesis but is still nauseated. Pain well controlled with current pain regimen. Has been up ambulating. No flatus yet.  UOP good. VSS.   Objective: Vital signs in last 24 hours: Temp:  [97.8 F (36.6 C)-98.7 F (37.1 C)] 98 F (36.7 C) (01/31 0500) Pulse Rate:  [57-78] 59 (01/31 0500) Resp:  [10-26] 16 (01/31 0500) BP: (95-136)/(51-87) 107/59 (01/31 0500) SpO2:  [95 %-100 %] 99 % (01/31 0500) Last BM Date: 06/28/17  Intake/Output from previous day: 01/30 0701 - 01/31 0700 In: 2085.8 [P.O.:630; I.V.:1455.8] Out: 750 [Urine:700; Blood:50] Intake/Output this shift: No intake/output data recorded.  PE: Gen:  Alert, NAD, pleasant Card:  Regular rate and rhythm, pedal pulses 2+ BL Pulm:  Normal effort, clear to auscultation bilaterally Abd: Soft, appropriately tender, non-distended, bowel sounds hypoactive, no HSM, incisions C/D/I Skin: warm and dry, no rashes  Psych: A&Ox3   Lab Results:  Recent Labs    06/28/17 2335  WBC 8.7  HGB 11.2*  HCT 33.9*  PLT 292   BMET Recent Labs    06/28/17 2335  NA 137  K 3.7  CL 102  CO2 24  GLUCOSE 102*  BUN 18  CREATININE 0.71  CALCIUM 8.6*   PT/INR No results for input(s): LABPROT, INR in the last 72 hours. CMP     Component Value Date/Time   NA 137 06/28/2017 2335   K 3.7 06/28/2017 2335   CL 102 06/28/2017 2335   CO2 24 06/28/2017 2335   GLUCOSE 102 (H) 06/28/2017 2335   BUN 18 06/28/2017 2335   CREATININE 0.71 06/28/2017 2335   CALCIUM 8.6 (L) 06/28/2017 2335   PROT 8.3 (H) 06/28/2017 2335   ALBUMIN 4.0 06/28/2017 2335   AST 24 06/28/2017 2335   ALT 17 06/28/2017 2335   ALKPHOS 56 06/28/2017 2335   BILITOT 0.2 (L) 06/28/2017 2335   GFRNONAA >60 06/28/2017 2335   GFRAA >60 06/28/2017 2335   Lipase     Component Value Date/Time   LIPASE 45  06/28/2017 2335       Studies/Results: US Abdomen Limited Ruq  Result Date: 06/29/2017 CLINICAL DATA:  33 y/o F; epigastric and right upper quadrant pain for 10-12 hours. EXAM: ULTRASOUND ABDOMEN LIMITED RIGHT UPPER QUADRANT COMPARISON:  09/30/2014 CT abdomen and pelvis FINDINGS: Gallbladder: Wall echo shadow sign, poor visualization of the gallbladder. Positive sonographic Murphy's sign. Pericholecystic fluid. Common bile duct: Diameter: 2.4 mm Liver: 1.6 cm echogenic focus, likely hemangioma. Within normal limits in parenchymal echogenicity. Portal vein is patent on color Doppler imaging with normal direction of blood flow towards the liver. IMPRESSION: 1. Wall echo shadow sign, likely multiple gallstones. Positive sonographic Murphy's sign. Pericholecystic fluid. Findings are compatible with acute cholecystitis in the appropriate clinical setting. 2. 1.6 cm echogenic focus in the liver, likely hemangioma. Electronically Signed   By: Mitzi Hansen M.D.   On: 06/29/2017 00:56    Anti-infectives: Anti-infectives (From admission, onward)   Start     Dose/Rate Route Frequency Ordered Stop   06/29/17 0215  ciprofloxacin (CIPRO) IVPB 400 mg     400 mg 200 mL/hr over 60 Minutes Intravenous  Once 06/29/17 0214 06/29/17 0537       Assessment/Plan Symptomatic cholelithiasis S/P laparoscopic cholecystectomy 1/30 Dr. Sheliah Hatch - POD#1 - prn zofran for nausea - ambulate, OOB, IS - possibly d/c this  afternoon if feeling better after lunch  FEN: reg diet VTE: SCDs, lovenox ID: cipro periop   LOS: 0 days    Breanna English , [facility] 06/30/2017, 9:35 AM Pager: [phone] Consults: [phone] Mon-Fri 7:00 am-4:30 pm Sat-Sun 7:00 am-11:30 am

## 2017-06-30 NOTE — Plan of Care (Signed)
  Health Behavior/Discharge Planning: Ability to manage health-related needs will improve 06/30/2017 2009 - Progressing by Luther Redourgott, Randell Teare, RN   Clinical Measurements: Ability to maintain clinical measurements within normal limits will improve 06/30/2017 2009 - Progressing by Luther Redourgott, Caprice Mccaffrey, RN   Pain Managment: General experience of comfort will improve 06/30/2017 2009 - Progressing by Luther Redourgott, Elza Sortor, RN

## 2017-07-01 ENCOUNTER — Inpatient Hospital Stay (HOSPITAL_COMMUNITY): Payer: Self-pay

## 2017-07-01 MED ORDER — GABAPENTIN 100 MG PO CAPS
100.0000 mg | ORAL_CAPSULE | Freq: Two times a day (BID) | ORAL | Status: DC
  Administered 2017-07-01 – 2017-07-03 (×5): 100 mg via ORAL
  Filled 2017-07-01 (×5): qty 1

## 2017-07-01 MED ORDER — ONDANSETRON 4 MG PO TBDP
4.0000 mg | ORAL_TABLET | Freq: Four times a day (QID) | ORAL | Status: DC
  Administered 2017-07-03: 4 mg via ORAL
  Filled 2017-07-01 (×13): qty 1

## 2017-07-01 MED ORDER — PROCHLORPERAZINE EDISYLATE 5 MG/ML IJ SOLN: 10.0000 mg | Freq: Four times a day (QID) | INTRAMUSCULAR | Status: DC | PRN

## 2017-07-01 MED ORDER — PROMETHAZINE HCL 25 MG/ML IJ SOLN: 12.5000 mg | Freq: Four times a day (QID) | INTRAMUSCULAR | Status: DC | PRN

## 2017-07-01 MED ORDER — MORPHINE SULFATE (PF) 2 MG/ML IV SOLN: 2.0000 mg | INTRAVENOUS | Status: DC | PRN

## 2017-07-01 MED ORDER — ONDANSETRON HCL 4 MG/2ML IJ SOLN
4.0000 mg | Freq: Four times a day (QID) | INTRAMUSCULAR | Status: DC
  Administered 2017-07-01 – 2017-07-04 (×10): 4 mg via INTRAVENOUS
  Filled 2017-07-01 (×10): qty 2

## 2017-07-01 NOTE — Progress Notes (Signed)
[facility] Progress Note  2 Days Post-Op   Assessment/Plan Symptomatic cholelithiasis S/P laparoscopic cholecystectomy 1/30 Dr. Sheliah HatchKinsinger - POD#2 - prn zofran for nausea - ambulate, OOB, IS - possibly d/c this afternoon if feeling better after lunch  FEN: reg diet VTE: SCDs, lovenox ID: cipro periop   LOS: 1 day    Subjective: CC: nausea and vomiting  Pt continues to vomit up whatever she eats. She has not ambulated out of the room. No flatus. No fever or chills. Encouraged ambulation  Objective: Vital signs in last 24 hours: Temp:  [98.2 F (36.8 C)-98.6 F (37 C)] 98.2 F (36.8 C) (02/01 0458) Pulse Rate:  [57-65] 60 (02/01 0458) Resp:  [17-18] 18 (02/01 0458) BP: (99-109)/(57-75) 99/57 (02/01 0458) SpO2:  [97 %-98 %] 97 % (02/01 0458) Last BM Date: 06/28/17  Intake/Output from previous day: 01/31 0701 - 02/01 0700 In: 1080.8 [P.O.:480; I.V.:600.8] Out: -  Intake/Output this shift: Total I/O In: 360 [P.O.:360] Out: -   PE: Gen:  Alert, NAD, pleasant, cooperative Card:  RRR, no M/G/R heard Pulm:  CTA anteriorly, no W/R/R, effort normal Abd: Soft, obese, not distended, hypoactive BS, incisions with glue intact and without drainage, mild TTP in RUQ, no guarding Skin: no rashes noted, warm and dry   Anti-infectives: Anti-infectives (From admission, onward)   Start     Dose/Rate Route Frequency Ordered Stop   06/29/17 0215  ciprofloxacin (CIPRO) IVPB 400 mg     400 mg 200 mL/hr over 60 Minutes Intravenous  Once 06/29/17 0214 06/29/17 0537      Lab Results:  Recent Labs    06/28/17 2335  WBC 8.7  HGB 11.2*  HCT 33.9*  PLT 292   BMET Recent Labs    06/28/17 2335  NA 137  K 3.7  CL 102  CO2 24  GLUCOSE 102*  BUN 18  CREATININE 0.71  CALCIUM 8.6*   PT/INR No results for input(s): LABPROT, INR in the last 72 hours. CMP     Component Value Date/Time   NA 137 06/28/2017 2335   K 3.7 06/28/2017 2335   CL 102  06/28/2017 2335   CO2 24 06/28/2017 2335   GLUCOSE 102 (H) 06/28/2017 2335   BUN 18 06/28/2017 2335   CREATININE 0.71 06/28/2017 2335   CALCIUM 8.6 (L) 06/28/2017 2335   PROT 8.3 (H) 06/28/2017 2335   ALBUMIN 4.0 06/28/2017 2335   AST 24 06/28/2017 2335   ALT 17 06/28/2017 2335   ALKPHOS 56 06/28/2017 2335   BILITOT 0.2 (L) 06/28/2017 2335   GFRNONAA >60 06/28/2017 2335   GFRAA >60 06/28/2017 2335   Lipase     Component Value Date/Time   LIPASE 45 06/28/2017 2335    Studies/Results: No results found.    Jerre SimonJessica L Saahil Herbster , [facility] 07/01/2017, 10:03 AM Pager: [phone] Consults: [phone] Mon-Fri 7:00 am-4:30 pm Sat-Sun 7:00 am-11:30 am

## 2017-07-01 NOTE — Progress Notes (Signed)
Pt is A&OX3. Pt is not tolerating meals. Pt is nauseous, has hypoactive bowel sounds, slightly distented; and has not passed flatus.

## 2017-07-01 NOTE — Plan of Care (Signed)
  Nutrition: Adequate nutrition will be maintained 07/01/2017 0751 - Not Progressing by Luther Redourgott, Girolamo Lortie, RN   Pt is not tolerating meals. Pt is nauseous, has hypoactive bowel sounds, and   slight distented. Pt is not passing flatus.

## 2017-07-02 MED ORDER — DOCUSATE SODIUM 100 MG PO CAPS
100.0000 mg | ORAL_CAPSULE | Freq: Two times a day (BID) | ORAL | Status: DC
  Administered 2017-07-02 – 2017-07-04 (×5): 100 mg via ORAL
  Filled 2017-07-02 (×5): qty 1

## 2017-07-02 MED ORDER — BISACODYL 10 MG RE SUPP
10.0000 mg | Freq: Every day | RECTAL | Status: DC | PRN
  Administered 2017-07-03: 10 mg via RECTAL

## 2017-07-02 MED ORDER — POLYETHYLENE GLYCOL 3350 17 G PO PACK
17.0000 g | PACK | Freq: Every day | ORAL | Status: DC
  Administered 2017-07-02: 17 g via ORAL
  Filled 2017-07-02: qty 1

## 2017-07-02 MED ORDER — POLYETHYLENE GLYCOL 3350 17 G PO PACK
17.0000 g | PACK | Freq: Two times a day (BID) | ORAL | Status: DC
  Administered 2017-07-02 – 2017-07-04 (×4): 17 g via ORAL
  Filled 2017-07-02 (×5): qty 1

## 2017-07-02 MED ORDER — HYDROCODONE-ACETAMINOPHEN 5-325 MG PO TABS: 1.0000 | ORAL_TABLET | Freq: Four times a day (QID) | ORAL | 0 refills | Status: DC | PRN

## 2017-07-02 MED ORDER — DOCUSATE SODIUM 100 MG PO CAPS: 100.0000 mg | ORAL_CAPSULE | Freq: Two times a day (BID) | ORAL | 0 refills | Status: DC

## 2017-07-02 MED ORDER — ONDANSETRON 4 MG PO TBDP: 4.0000 mg | ORAL_TABLET | Freq: Four times a day (QID) | ORAL | 0 refills | Status: DC | PRN

## 2017-07-02 MED ORDER — MORPHINE SULFATE (PF) 2 MG/ML IV SOLN
2.0000 mg | INTRAVENOUS | Status: DC | PRN
  Administered 2017-07-02 – 2017-07-03 (×2): 2 mg via INTRAVENOUS
  Filled 2017-07-02 (×2): qty 1

## 2017-07-02 MED ORDER — IBUPROFEN 200 MG PO TABS
400.0000 mg | ORAL_TABLET | Freq: Four times a day (QID) | ORAL | Status: DC
  Administered 2017-07-02 – 2017-07-04 (×7): 400 mg via ORAL
  Filled 2017-07-02 (×8): qty 2

## 2017-07-02 MED ORDER — POLYETHYLENE GLYCOL 3350 17 G PO PACK: 17.0000 g | PACK | Freq: Every day | ORAL | 0 refills | Status: DC

## 2017-07-02 NOTE — Progress Notes (Signed)
Pt vomited after eating lunch (soft diet) PT was give nausea medication prior to because she felt nauseous changing pt to a clear liquid diet. RN notified PA waiting for call back.

## 2017-07-02 NOTE — Progress Notes (Signed)
[facility] Progress Note  3 Days Post-Op  Subjective: CC-  Nausea improved, patient reports no recent emesis. Tolerating clear liquids, feels like she could tolerate some solid food. No flatus or BM. She is ambulating frequently.  Xray yesterday showed no obstruction or ileus.  Objective: Vital signs in last 24 hours: Temp:  [97.8 F (36.6 C)-98.2 F (36.8 C)] 97.8 F (36.6 C) (02/02 0739) Pulse Rate:  [59-62] 62 (02/02 0739) Resp:  [16-17] 16 (02/02 0459) BP: (93-102)/(60-67) 102/61 (02/02 0739) SpO2:  [99 %-100 %] 99 % (02/02 0739) Last BM Date: 06/28/17  Intake/Output from previous day: 02/01 0701 - 02/02 0700 In: 2080 [P.O.:1680; I.V.:400] Out: -  Intake/Output this shift: No intake/output data recorded.  PE: Gen:  Alert, NAD, pleasant HEENT: EOM's intact, pupils equal and round Pulm:  effort normal Abd: Soft, ND, NT, few BS heard, lap incisions cdi without erythema or drainage Psych: A&Ox3  Skin: no rashes noted, warm and dry  Lab Results:  No results for input(s): WBC, HGB, HCT, PLT in the last 72 hours. BMET No results for input(s): NA, K, CL, CO2, GLUCOSE, BUN, CREATININE, CALCIUM in the last 72 hours. PT/INR No results for input(s): LABPROT, INR in the last 72 hours. CMP     Component Value Date/Time   NA 137 06/28/2017 2335   K 3.7 06/28/2017 2335   CL 102 06/28/2017 2335   CO2 24 06/28/2017 2335   GLUCOSE 102 (H) 06/28/2017 2335   BUN 18 06/28/2017 2335   CREATININE 0.71 06/28/2017 2335   CALCIUM 8.6 (L) 06/28/2017 2335   PROT 8.3 (H) 06/28/2017 2335   ALBUMIN 4.0 06/28/2017 2335   AST 24 06/28/2017 2335   ALT 17 06/28/2017 2335   ALKPHOS 56 06/28/2017 2335   BILITOT 0.2 (L) 06/28/2017 2335   GFRNONAA >60 06/28/2017 2335   GFRAA >60 06/28/2017 2335   Lipase     Component Value Date/Time   LIPASE 45 06/28/2017 2335       Studies/Results: Dg Abd Portable 1v  Result Date: 07/01/2017 CLINICAL DATA:  Ileus EXAM: PORTABLE  ABDOMEN - 1 VIEW COMPARISON:  Abdominal CT 09/30/2014 FINDINGS: Few loops of small bowel containing gas. No over distention for obstruction or definite ileus. No excessive stool retention. No concerning mass effect or gas collection. No definite urolithiasis, pelvic calcifications correlating with phleboliths on 09/30/2014 abdominal CT. IMPRESSION: Normal bowel gas pattern. If progressive symptoms a follow-up could be obtained. Electronically Signed   By: Marnee SpringJonathon  Watts M.D.   On: 07/01/2017 11:40    Anti-infectives: Anti-infectives (From admission, onward)   Start     Dose/Rate Route Frequency Ordered Stop   06/29/17 0215  ciprofloxacin (CIPRO) IVPB 400 mg     400 mg 200 mL/hr over 60 Minutes Intravenous  Once 06/29/17 0214 06/29/17 0537       Assessment/Plan Symptomatic cholelithiasis S/P laparoscopic cholecystectomy 1/30 Dr. Sheliah HatchKinsinger - POD#3 - ambulate, OOB, IS - zofran for nausea  FEN: IVF, soft diet, miralax, colace VTE: SCDs, lovenox ID: cipro periop Follow up: [facility]  Plan: Nausea improved, but no flatus or BM. Add miralax and colace. Add ibuprofen for better pain control and to help limit narcotics. Continue ambulating.  If ready for discharge later today work note and rx on chart, discharge info and follow up on AVS.   LOS: 2 days    Franne FortsBrooke A Khala Tarte , [facility] 07/02/2017, 8:49 AM Pager: [phone] Consults: [phone] Mon-Fri 7:00 am-4:30 pm Sat-Sun 7:00  am-11:30 am

## 2017-07-03 ENCOUNTER — Encounter (HOSPITAL_COMMUNITY): Payer: Self-pay | Admitting: *Deleted

## 2017-07-03 LAB — COMPREHENSIVE METABOLIC PANEL
ALT: 44 U/L (ref 14–54)
AST: 47 U/L — AB (ref 15–41)
Albumin: 2.9 g/dL — ABNORMAL LOW (ref 3.5–5.0)
Alkaline Phosphatase: 62 U/L (ref 38–126)
Anion gap: 8 (ref 5–15)
BUN: 6 mg/dL (ref 6–20)
CHLORIDE: 104 mmol/L (ref 101–111)
CO2: 24 mmol/L (ref 22–32)
CREATININE: 0.76 mg/dL (ref 0.44–1.00)
Calcium: 8.1 mg/dL — ABNORMAL LOW (ref 8.9–10.3)
Glucose, Bld: 98 mg/dL (ref 65–99)
POTASSIUM: 3.7 mmol/L (ref 3.5–5.1)
SODIUM: 136 mmol/L (ref 135–145)
Total Bilirubin: 0.5 mg/dL (ref 0.3–1.2)
Total Protein: 6.2 g/dL — ABNORMAL LOW (ref 6.5–8.1)

## 2017-07-03 LAB — CBC
HCT: 30.6 % — ABNORMAL LOW (ref 36.0–46.0)
Hemoglobin: 10 g/dL — ABNORMAL LOW (ref 12.0–15.0)
MCH: 28.7 pg (ref 26.0–34.0)
MCHC: 32.7 g/dL (ref 30.0–36.0)
MCV: 87.7 fL (ref 78.0–100.0)
PLATELETS: 256 10*3/uL (ref 150–400)
RBC: 3.49 MIL/uL — AB (ref 3.87–5.11)
RDW: 14.5 % (ref 11.5–15.5)
WBC: 9.4 10*3/uL (ref 4.0–10.5)

## 2017-07-03 MED ORDER — BISACODYL 10 MG RE SUPP
10.0000 mg | Freq: Once | RECTAL | Status: DC
  Filled 2017-07-03: qty 1

## 2017-07-03 MED ORDER — ACETAMINOPHEN 500 MG PO TABS: 1000.0000 mg | ORAL_TABLET | Freq: Four times a day (QID) | ORAL | Status: DC | PRN

## 2017-07-03 NOTE — Progress Notes (Signed)
Surgery:  She had some nausea and vomiting yesterday Her abdomen is completely benign Wonder if this is secondary to narcotics  I've discontinued Norco and morphine Allowed Tylenol or tramadol Home when tolerating diet   Tranquilino Fischler M. Derrell LollingIngram, M.D., [facility] General and Minimally invasive Surgery Breast and Colorectal Surgery

## 2017-07-03 NOTE — Progress Notes (Signed)
[facility] Progress Note  4 Days Post-Op  Subjective: CC- nausea Patient states that she got nauseated and vomited again yesterday, she was placed back on clear liquid diet and miralax increased to BID. She has not had a suppository yet.  States that she feels more rumbling in her abdomen today. She passed a small amount of flatus yesterday.  Objective: Vital signs in last 24 hours: Temp:  [98.1 F (36.7 C)-99.2 F (37.3 C)] 98.6 F (37 C) (02/03 0524) Pulse Rate:  [65-82] 75 (02/03 0524) Resp:  [18] 18 (02/03 0524) BP: (106-120)/(60-73) 110/60 (02/03 0524) SpO2:  [98 %-100 %] 98 % (02/03 0524) Last BM Date: 06/28/17  Intake/Output from previous day: 02/02 0701 - 02/03 0700 In: 580 [P.O.:580] Out: -  Intake/Output this shift: Total I/O In: 200 [I.V.:200] Out: -   PE: Gen:  Alert, NAD, pleasant HEENT: EOM's intact, pupils equal and round Pulm:  effort normal Abd: Soft, ND, NT, + BS, lap incisions cdi without erythema or drainage Psych: A&Ox3  Skin: no rashes noted, warm and dry  Lab Results:  No results for input(s): WBC, HGB, HCT, PLT in the last 72 hours. BMET No results for input(s): NA, K, CL, CO2, GLUCOSE, BUN, CREATININE, CALCIUM in the last 72 hours. PT/INR No results for input(s): LABPROT, INR in the last 72 hours. CMP     Component Value Date/Time   NA 137 06/28/2017 2335   K 3.7 06/28/2017 2335   CL 102 06/28/2017 2335   CO2 24 06/28/2017 2335   GLUCOSE 102 (H) 06/28/2017 2335   BUN 18 06/28/2017 2335   CREATININE 0.71 06/28/2017 2335   CALCIUM 8.6 (L) 06/28/2017 2335   PROT 8.3 (H) 06/28/2017 2335   ALBUMIN 4.0 06/28/2017 2335   AST 24 06/28/2017 2335   ALT 17 06/28/2017 2335   ALKPHOS 56 06/28/2017 2335   BILITOT 0.2 (L) 06/28/2017 2335   GFRNONAA >60 06/28/2017 2335   GFRAA >60 06/28/2017 2335   Lipase     Component Value Date/Time   LIPASE 45 06/28/2017 2335       Studies/Results: Dg Abd Portable 1v  Result Date:  07/01/2017 CLINICAL DATA:  Ileus EXAM: PORTABLE ABDOMEN - 1 VIEW COMPARISON:  Abdominal CT 09/30/2014 FINDINGS: Few loops of small bowel containing gas. No over distention for obstruction or definite ileus. No excessive stool retention. No concerning mass effect or gas collection. No definite urolithiasis, pelvic calcifications correlating with phleboliths on 09/30/2014 abdominal CT. IMPRESSION: Normal bowel gas pattern. If progressive symptoms a follow-up could be obtained. Electronically Signed   By: Marnee SpringJonathon  Watts M.D.   On: 07/01/2017 11:40    Anti-infectives: Anti-infectives (From admission, onward)   Start     Dose/Rate Route Frequency Ordered Stop   06/29/17 0215  ciprofloxacin (CIPRO) IVPB 400 mg     400 mg 200 mL/hr over 60 Minutes Intravenous  Once 06/29/17 0214 06/29/17 0537       Assessment/Plan Symptomatic cholelithiasis S/P laparoscopic cholecystectomy 1/30 Dr. Sheliah HatchKinsinger - POD#4 - ambulate, OOB, IS - zofran for nausea  FEN: IVF, clear liquid diet, miralax and colace BID  VTE: SCDs, lovenox ID: cipro periop Follow up: [facility]  Plan: Check CBC/CMP. Give dulcolax suppository and continue miralax/colace. Continue ambulating. If patient is able to have a BM ok to trial diet advancement again.    LOS: 3 days    Franne FortsBrooke A Michelyn Scullin , [facility] 07/03/2017, 9:07 AM Pager: [phone] Consults: [phone] Mon-Fri 7:00 am-4:30 pm  Sat-Sun 7:00 am-11:30 am

## 2017-07-04 MED ORDER — TRAMADOL HCL 50 MG PO TABS: 50.0000 mg | ORAL_TABLET | Freq: Four times a day (QID) | ORAL | 0 refills | Status: DC | PRN

## 2017-07-04 MED ORDER — ONDANSETRON 4 MG PO TBDP: 4.0000 mg | ORAL_TABLET | Freq: Four times a day (QID) | ORAL | 0 refills | Status: DC | PRN

## 2017-07-04 NOTE — Discharge Summary (Signed)
[facility] Dis[facility] Summary   Patient ID: Breanna English MRN: [id number] DOB/AGE: 34/06/1985 34 y.o.  Admit date: 06/28/2017 Discharge date: 07/04/2017  Admitting Diagnosis: cholelithiasis  Discharge Diagnosis Patient Active Problem List   Diagnosis Date Noted  . Acute cholecystitis due to biliary calculus 06/29/2017    Consultants none  Imaging: No results found.  Procedures Dr. Sheliah HatchKinsinger (06/29/17) - Laparoscopic Cholecystectomy   Hospital Course:  Pt is a 32 year old obese female [facility].  Workup showed gallstones.  Patient was admitted and underwent procedure listed above.  Tolerated procedure well and was transferred to the floor.  Diet was advanced as tolerated. Hospital course complicated by nausea and vomiting which resolved. Pt was having flatus but no BM.  On POD#5, the patient was voiding well, tolerating diet, ambulating well, pain well controlled, vital signs stable, incisions c/d/i and felt stable for discharge home.  Patient will follow up in our office in 2 weeks and knows to call with questions or concerns.  She will call to confirm appointment date/time.    Patient was discharged in good condition.  Physical Exam: General:  Alert, NAD, pleasant, cooperative Cardio: RRR, S1 & S2 normal, no murmur, rubs, gallops Resp: Effort normal, lungs CTA bilaterally, no wheezes, rales, rhonchi Abd:  Soft, ND, +BS, mild TTP in epigastric and RUQ regions without guarding, incisions with glue intact are without drainage or surrounding erythema  Skin: warm and dry, no rashes noted  Allergies as of 07/04/2017      Reactions   Penicillins Anaphylaxis   seizure      Medication List    STOP taking these medications   naproxen 500 MG tablet Commonly known as:  NAPROSYN     TAKE these medications   docusate sodium 100 MG capsule Commonly known as:  COLACE Take 1 capsule (100 mg total) by mouth 2 (two) times daily.    ondansetron 4 MG disintegrating tablet Commonly known as:  ZOFRAN-ODT Take 1 tablet (4 mg total) by mouth every 6 (six) hours as needed for nausea or vomiting.   polyethylene glycol packet Commonly known as:  MIRALAX / GLYCOLAX Take 17 g by mouth daily.   traMADol 50 MG tablet Commonly known as:  ULTRAM Take 1 tablet (50 mg total) by mouth every 6 (six) hours as needed (mild pain).        Follow-up Information    [facility] Go on 07/12/2017.   Specialty:  General Surgery Why:  Your appointment is at 10:15 AM. Please arrive 30 min prior to appointment time. Bring photo ID and insurance information.  Contact information: [address] [place] [address] [phone]           Signed: Joyce CopaJessica L [facility] 07/04/2017, 10:19 AM Pager: [phone] Consults: [phone] Mon-Fri 7:00 am-4:30 pm Sat-Sun 7:00 am-11:30 am

## 2017-07-04 NOTE — Discharge Instructions (Signed)
Please arrive at least 30 min before your appointment to complete your check in paperwork.  If you are unable to arrive 30 min prior to your appointment time we may have to cancel or reschedule you. ° °LAPAROSCOPIC SURGERY: POST OP INSTRUCTIONS  °1. DIET: Follow a light bland diet the first 24 hours after arrival home, such as soup, liquids, crackers, etc. Be sure to include lots of fluids daily. Avoid fast food or heavy meals as your are more likely to get nauseated. Eat a low fat the next few days after surgery.  °2. Take your usually prescribed home medications unless otherwise directed. °3. PAIN CONTROL:  °1. Pain is best controlled by a usual combination of three different methods TOGETHER:  °1. Ice/Heat °2. Over the counter pain medication °3. Prescription pain medication °2. Most patients will experience some swelling and bruising around the incisions. Ice packs or heating pads (30-60 minutes up to 6 times a day) will help. Use ice for the first few days to help decrease swelling and bruising, then switch to heat to help relax tight/sore spots and speed recovery. Some people prefer to use ice alone, heat alone, alternating between ice & heat. Experiment to what works for you. Swelling and bruising can take several weeks to resolve.  °3. It is helpful to take an over-the-counter pain medication regularly for the first few weeks. Choose one of the following that works best for you:  °1. Naproxen (Aleve, etc) Two 220mg tabs twice a day °2. Ibuprofen (Advil, etc) Three 200mg tabs four times a day (every meal & bedtime) °3. Acetaminophen (Tylenol, etc) 500-650mg four times a day (every meal & bedtime) °4. A prescription for pain medication (such as oxycodone, hydrocodone, etc) should be given to you upon discharge. Take your pain medication as prescribed.  °1. If you are having problems/concerns with the prescription medicine (does not control pain, nausea, vomiting, rash, itching, etc), please call us [phone] to see if we need to switch you to a different pain medicine that will work better for you and/or control your side effect better. °2. If you need a refill on your pain medication, please contact your pharmacy. They will contact our office to request authorization. Prescriptions will not be filled after 5 pm or on week-ends. °4. Avoid getting constipated. Between the surgery and the pain medications, it is common to experience some constipation. Increasing fluid intake and taking a fiber supplement (such as Metamucil, Citrucel, FiberCon, MiraLax, etc) 1-2 times a day regularly will usually help prevent this problem from occurring. A mild laxative (prune juice, Milk of Magnesia, MiraLax, etc) should be taken according to package directions if there are no bowel movements after 48 hours.  °5. Watch out for diarrhea. If you have many loose bowel movements, simplify your diet to bland foods & liquids for a few days. Stop any stool softeners and decrease your fiber supplement. Switching to mild anti-diarrheal medications (Kayopectate, Pepto Bismol) can help. If this worsens or does not improve, please call us. °6. Wash / shower every day. You may shower over the dressings as they are waterproof. Continue to shower over incision(s) after the dressing is off. °7. Remove your waterproof bandages 5 days after surgery. You may leave the incision open to air. You may replace a dressing/Band-Aid to cover the incision for comfort if you wish.  °8. ACTIVITIES as tolerated:  °1. You may resume regular (light) daily activities beginning the next day--such as daily self-care, walking, climbing stairs--gradually   increasing activities as tolerated. If you can walk 30 minutes without difficulty, it is safe to try more intense activity such as jogging, treadmill, bicycling, low-impact aerobics, swimming, etc. 2. Save the most intensive and strenuous activity for last such as sit-ups, heavy lifting, contact sports, etc Refrain  from any heavy lifting or straining until you are off narcotics for pain control.  3. DO NOT PUSH THROUGH PAIN. Let pain be your guide: If it hurts to do something, don't do it. Pain is your body warning you to avoid that activity for another week until the pain goes down. 4. You may drive when you are no longer taking prescription pain medication, you can comfortably wear a seatbelt, and you can safely maneuver your car and apply brakes. 5. You may have sexual intercourse when it is comfortable.  9. FOLLOW UP in our office  1. Please call [facility] at [phone] to set up an appointment to see your surgeon in the office for a follow-up appointment approximately 2-3 weeks after your surgery. 2. Make sure that you call for this appointment the day you arrive home to insure a convenient appointment time.      10. IF YOU HAVE DISABILITY OR FAMILY LEAVE FORMS, BRING THEM TO THE               OFFICE FOR PROCESSING.   WHEN TO CALL us [phone]:  1. Poor pain control 2. Reactions / problems with new medications (rash/itching, nausea, etc)  3. Fever over 101.5 F (38.5 C) 4. Inability to urinate 5. Nausea and/or vomiting 6. Worsening swelling or bruising 7. Continued bleeding from incision. 8. Increased pain, redness, or drainage from the incision  The clinic staff is available to answer your questions during regular business hours (8:30am-5pm). Please dont hesitate to call and ask to speak to one of our nurses for clinical concerns.  If you have a medical emergency, go to the nearest emergency room or call 911.  A surgeon from [facility] is always on call at the [facility], [place]  [address], [place], [place] [address] ?  MAIN: [phone] ? TOLL FREE: [phone] ?  FAX [phone]  [email or web address]  Soft-Food Meal Plan Follow for 3-5 days after discharge A soft-food meal plan includes foods that are safe and  easy to swallow. This meal plan typically is used:  If you are having trouble chewing or swallowing foods.  As a transition meal plan after only having had liquid meals for a long period.  What do I need to know about the soft-food meal plan? A soft-food meal plan includes tender foods that are soft and easy to chew and swallow. In most cases, bite-sized pieces of food are easier to swallow. A bite-sized piece is about  inch or smaller. Foods in this plan do not need to be ground or pureed. Foods that are very hard, crunchy, or sticky should be avoided. Also, breads, cereals, yogurts, and desserts with nuts, seeds, or fruits should be avoided. What foods can I eat? Grains Rice and wild rice. Moist bread, dressing, pasta, and noodles. Well-moistened dry or cooked cereals, such as farina (cooked wheat cereal), oatmeal, or grits. Biscuits, breads, muffins, pancakes, and waffles that have been well moistened. Vegetables Shredded lettuce. Cooked, tender vegetables, including potatoes without skins. Vegetable juices. Broths or creamed soups made with vegetables that are not stringy or chewy. Strained tomatoes (without seeds). Fruits Canned or well-cooked  fruits. Soft (ripe), peeled fresh fruits, such as peaches, nectarines, kiwi, cantaloupe, honeydew melon, and watermelon (without seeds). Soft berries with small seeds, such as strawberries. Fruit juices (without pulp). Meats and Other Protein Sources Moist, tender, lean beef. Mutton. Lamb. Veal. Chicken. [place]key. Liver. Ham. Fish without bones. Eggs. Dairy Milk, milk drinks, and cream. Plain cream cheese and cottage cheese. Plain yogurt. Sweets/Desserts Flavored gelatin desserts. Custard. Plain ice cream, frozen yogurt, sherbet, milk shakes, and malts. Plain cakes and cookies. Plain hard candy. Other Butter, margarine (without trans fat), and cooking oils. Mayonnaise. Cream sauces. Mild spices, salt, and sugar. Syrup, molasses, honey, and  jelly. The items listed above may not be a complete list of recommended foods or beverages. Contact your dietitian for more options. What foods are not recommended? Grains Dry bread, toast, crackers that have not been moistened. Coarse or dry cereals, such as bran, granola, and shredded wheat. Tough or chewy crusty breads, such as [place] bread or baguettes. Vegetables Corn. Raw vegetables except shredded lettuce. Cooked vegetables that are tough or stringy. Tough, crisp, fried potatoes and potato skins. Fruits Fresh fruits with skins or seeds or both, such as apples, pears, or grapes. Stringy, high-pulp fruits, such as papaya, pineapple, coconut, or mango. Fruit leather, fruit roll-ups, and all dried fruits. Meats and Other Protein Sources Sausages and hot dogs. Meats with gristle. Fish with bones. Nuts, seeds, and chunky peanut or other nut butters. Sweets/Desserts Cakes or cookies that are very dry or chewy. The items listed above may not be a complete list of foods and beverages to avoid. Contact your dietitian for more information. This information is not intended to replace advice given to you by your health care provider. Make sure you discuss any questions you have with your health care provider. Document Released: 08/24/2007 Document Revised: 10/23/2015 Document Reviewed: 04/13/2013 Elsevier Interactive Patient Education  2017 [employer]

## 2018-01-12 ENCOUNTER — Emergency Department (HOSPITAL_BASED_OUTPATIENT_CLINIC_OR_DEPARTMENT_OTHER)
Admission: EM | Admit: 2018-01-12 | Discharge: 2018-01-13 | Disposition: A | Discharge status: Home / Self Care | Payer: Self-pay | Attending: Emergency Medicine | Admitting: Emergency Medicine

## 2018-01-12 ENCOUNTER — Emergency Department (HOSPITAL_COMMUNITY)
Admission: EM | Admit: 2018-01-12 | Discharge: 2018-01-12 | Disposition: A | Discharge status: Home / Self Care | Payer: Self-pay | Attending: Emergency Medicine | Admitting: Emergency Medicine

## 2018-01-12 ENCOUNTER — Emergency Department (HOSPITAL_BASED_OUTPATIENT_CLINIC_OR_DEPARTMENT_OTHER): Payer: Self-pay

## 2018-01-12 ENCOUNTER — Encounter (HOSPITAL_COMMUNITY): Payer: Self-pay | Admitting: Emergency Medicine

## 2018-01-12 ENCOUNTER — Other Ambulatory Visit: Payer: Self-pay

## 2018-01-12 ENCOUNTER — Encounter (HOSPITAL_BASED_OUTPATIENT_CLINIC_OR_DEPARTMENT_OTHER): Payer: Self-pay

## 2018-01-12 DIAGNOSIS — R109 Unspecified abdominal pain: Secondary | ICD-10-CM | POA: Insufficient documentation

## 2018-01-12 DIAGNOSIS — Z87891 Personal history of nicotine dependence: Secondary | ICD-10-CM | POA: Insufficient documentation

## 2018-01-12 DIAGNOSIS — Z79899 Other long term (current) drug therapy: Secondary | ICD-10-CM | POA: Insufficient documentation

## 2018-01-12 DIAGNOSIS — Z5321 Procedure and treatment not carried out due to patient leaving prior to being seen by health care provider: Secondary | ICD-10-CM | POA: Insufficient documentation

## 2018-01-12 DIAGNOSIS — K529 Noninfective gastroenteritis and colitis, unspecified: Secondary | ICD-10-CM | POA: Insufficient documentation

## 2018-01-12 LAB — URINALYSIS, ROUTINE W REFLEX MICROSCOPIC
BILIRUBIN URINE: NEGATIVE
GLUCOSE, UA: NEGATIVE mg/dL
KETONES UR: NEGATIVE mg/dL
Leukocytes, UA: NEGATIVE
Nitrite: NEGATIVE
PH: 6.5 (ref 5.0–8.0)
PROTEIN: NEGATIVE mg/dL
Specific Gravity, Urine: 1.015 (ref 1.005–1.030)

## 2018-01-12 LAB — COMPREHENSIVE METABOLIC PANEL
ALBUMIN: 3.6 g/dL (ref 3.5–5.0)
ALK PHOS: 73 U/L (ref 38–126)
ALT: 38 U/L (ref 0–44)
ANION GAP: 9 (ref 5–15)
AST: 40 U/L (ref 15–41)
BILIRUBIN TOTAL: 0.5 mg/dL (ref 0.3–1.2)
BUN: 8 mg/dL (ref 6–20)
CALCIUM: 8.9 mg/dL (ref 8.9–10.3)
CO2: 25 mmol/L (ref 22–32)
CREATININE: 0.68 mg/dL (ref 0.44–1.00)
Chloride: 104 mmol/L (ref 98–111)
GFR calc Af Amer: >60 mL/min (ref >60)
GFR calc non Af Amer: >60 mL/min (ref >60)
Glucose, Bld: 93 mg/dL (ref 70–99)
Potassium: 3.9 mmol/L (ref 3.5–5.1)
Sodium: 138 mmol/L (ref 135–145)
TOTAL PROTEIN: 7.4 g/dL (ref 6.5–8.1)

## 2018-01-12 LAB — I-STAT BETA HCG BLOOD, ED (MC, WL, AP ONLY)

## 2018-01-12 LAB — CBC
HCT: 34.8 % — ABNORMAL LOW (ref 36.0–46.0)
HEMOGLOBIN: 11.2 g/dL — AB (ref 12.0–15.0)
MCH: 28.5 pg (ref 26.0–34.0)
MCHC: 32.2 g/dL (ref 30.0–36.0)
MCV: 88.5 fL (ref 78.0–100.0)
PLATELETS: 280 10*3/uL (ref 150–400)
RBC: 3.93 MIL/uL (ref 3.87–5.11)
RDW: 13.9 % (ref 11.5–15.5)
WBC: 5.7 10*3/uL (ref 4.0–10.5)

## 2018-01-12 LAB — LIPASE, BLOOD: Lipase: 47 U/L (ref 11–51)

## 2018-01-12 LAB — URINALYSIS, MICROSCOPIC (REFLEX): WBC, UA: NONE SEEN WBC/hpf (ref 0–5)

## 2018-01-12 MED ORDER — SODIUM CHLORIDE 0.9 % IV BOLUS
1000.0000 mL | Freq: Once | INTRAVENOUS | Status: AC
  Administered 2018-01-12: 1000 mL via INTRAVENOUS

## 2018-01-12 MED ORDER — ONDANSETRON HCL 4 MG/2ML IJ SOLN
4.0000 mg | Freq: Once | INTRAMUSCULAR | Status: AC
  Administered 2018-01-12: 4 mg via INTRAVENOUS
  Filled 2018-01-12: qty 2

## 2018-01-12 MED ORDER — FENTANYL CITRATE (PF) 100 MCG/2ML IJ SOLN
50.0000 ug | Freq: Once | INTRAMUSCULAR | Status: AC
  Administered 2018-01-12: 50 ug via INTRAVENOUS
  Filled 2018-01-12: qty 2

## 2018-01-12 NOTE — ED Triage Notes (Signed)
Pt c/o generalized abdominal pain, nausea/vomiting x 1 week. Pt reports her emesis is "green and yellowish". Hx gallbladder removal in Feb 2019

## 2018-01-12 NOTE — ED Notes (Signed)
[facility]

## 2018-01-12 NOTE — ED Triage Notes (Signed)
C/o abd pain n/v/d x 4 days-NAD-steady gait

## 2018-01-12 NOTE — ED Provider Notes (Signed)
Is not resulted as we wish you there  [facility] Provider Note: Lowella DellJ. Lane Persia Lintner, MD, FACEP  CSN: [id number] MRN: [id number] ARRIVAL: 01/12/18 at 2138 ROOM: MH03/MH03   CHIEF COMPLAINT  Abdominal Pain   HISTORY OF PRESENT ILLNESS  01/12/18 10:41 PM Breanna English is a 32 y.o. female with a 4-day history of nausea, vomiting and diarrhea.  She describes the diarrhea as watery but not bloody.  She describes emesis as bilious.  Over the past 2 days she has developed left upper quadrant pain radiating to her umbilicus.  She rates her pain as a 9 out of 10, worse with movement or palpation.  The pain also radiates into her left flank.  She is not aware of having a fever.  She has taken Pepto-Bismol without relief.    Past Medical History:  Diagnosis Date  . Ovarian cyst     Past Surgical History:  Procedure Laterality Date  . CHOLECYSTECTOMY N/A 06/29/2017   Procedure: LAPAROSCOPIC CHOLECYSTECTOMY;  Surgeon: Kinsinger, De BlanchLuke Aaron, MD;  Location: [facility] OR;  Service: General;  Laterality: N/A;  . LAPAROSCOPIC CHOLECYSTECTOMY  06/29/2017  . OVARIAN CYST REMOVAL  2002  . TUBAL LIGATION      No family history on file.  Social History   Tobacco Use  . Smoking status: Former Smoker    Types: Cigarettes    Last attempt to quit: 06/01/2015    Years since quitting: 2.6  . Smokeless tobacco: Never Used  Substance Use Topics  . Alcohol use: No  . Drug use: No    Prior to Admission medications   Medication Sig Start Date End Date Taking? Authorizing Provider  docusate sodium (COLACE) 100 MG capsule Take 1 capsule (100 mg total) by mouth 2 (two) times daily. 07/02/17   Meuth, Brooke A, PA-C  ondansetron (ZOFRAN-ODT) 4 MG disintegrating tablet Take 1 tablet (4 mg total) by mouth every 6 (six) hours as needed for nausea or vomiting. 07/04/17   Focht, Joyce CopaJessica L, PA  polyethylene glycol (MIRALAX / GLYCOLAX) packet Take 17 g by mouth daily. 07/02/17   Meuth, Brooke A, PA-C  traMADol (ULTRAM)  50 MG tablet Take 1 tablet (50 mg total) by mouth every 6 (six) hours as needed (mild pain). 07/04/17   Jerre SimonFocht, Jessica L, PA    Allergies Penicillins   REVIEW OF SYSTEMS  Negative except as noted here or in the History of Present Illness.   PHYSICAL EXAMINATION  Initial Vital Signs Blood pressure (!) 134/92, pulse 86, temperature 98 F (36.7 C), temperature source Oral, resp. rate 18, height 5\' 3"  (1.6 m), weight 117.5 kg, last menstrual period 01/08/2018, SpO2 96 %, unknown if currently breastfeeding.  Examination General: Well-developed, well-nourished female in no acute distress; appearance consistent with age of record HENT: normocephalic; atraumatic Eyes: pupils equal, round and reactive to light; extraocular muscles intact Neck: supple Heart: regular rate and rhythm Lungs: clear to auscultation bilaterally Abdomen: soft; nondistended; left upper quadrant tenderness; no masses or hepatosplenomegaly; bowel sounds present GU: Left CVA tenderness Extremities: No deformity; full range of motion; pulses normal Neurologic: Awake, alert and oriented; motor function intact in all extremities and symmetric; no facial droop Skin: Warm and dry Psychiatric: Normal mood and affect   RESULTS  Summary of this visit's results, reviewed by myself:   EKG Interpretation  Date/Time:    Ventricular Rate:    PR Interval:    QRS Duration:   QT Interval:    QTC Calculation:   R Axis:  Text Interpretation:        Laboratory Studies: Results for orders placed or performed during the hospital encounter of 01/12/18 (from the past 24 hour(s))  Urinalysis, Routine w reflex microscopic     Status: Abnormal   Collection Time: 01/12/18 10:11 PM  Result Value Ref Range   Color, Urine YELLOW YELLOW   APPearance CLEAR CLEAR   Specific Gravity, Urine 1.015 1.005 - 1.030   pH 6.5 5.0 - 8.0   Glucose, UA NEGATIVE NEGATIVE mg/dL   Hgb urine dipstick LARGE (A) NEGATIVE   Bilirubin Urine  NEGATIVE NEGATIVE   Ketones, ur NEGATIVE NEGATIVE mg/dL   Protein, ur NEGATIVE NEGATIVE mg/dL   Nitrite NEGATIVE NEGATIVE   Leukocytes, UA NEGATIVE NEGATIVE  Urinalysis, Microscopic (reflex)     Status: Abnormal   Collection Time: 01/12/18 10:11 PM  Result Value Ref Range   RBC / HPF 6-10 0 - 5 RBC/hpf   WBC, UA NONE SEEN 0 - 5 WBC/hpf   Bacteria, UA RARE (A) NONE SEEN   Squamous Epithelial / LPF 0-5 0 - 5   Imaging Studies: Ct Abdomen Pelvis W Contrast  Result Date: 01/13/2018 CLINICAL DATA:  Left lower quadrant and left abdominal pain for 4 days. Nausea, vomiting, and diarrhea. EXAM: CT ABDOMEN AND PELVIS WITH CONTRAST TECHNIQUE: Multidetector CT imaging of the abdomen and pelvis was performed using the standard protocol following bolus administration of intravenous contrast. CONTRAST:  100mL ISOVUE-300 IOPAMIDOL (ISOVUE-300) INJECTION 61% COMPARISON:  09/30/2014 FINDINGS: Lower chest: Lung bases are clear. Hepatobiliary: No focal liver abnormality is seen. Status post cholecystectomy. No biliary dilatation. Pancreas: Unremarkable. No pancreatic ductal dilatation or surrounding inflammatory changes. Spleen: Normal in size without focal abnormality. Adrenals/Urinary Tract: Adrenal glands are unremarkable. Kidneys are normal, without renal calculi, focal lesion, or hydronephrosis. Bladder is unremarkable. Stomach/Bowel: Stomach is within normal limits. Appendix appears normal. No evidence of bowel wall thickening, distention, or inflammatory changes. Vascular/Lymphatic: No significant vascular findings are present. No enlarged abdominal or pelvic lymph nodes. Reproductive: Uterus and bilateral adnexa are unremarkable. Other: No abdominal wall hernia or abnormality. No abdominopelvic ascites. Musculoskeletal: No acute or significant osseous findings. IMPRESSION: No acute process demonstrated in the abdomen or pelvis. No evidence of bowel obstruction or inflammation. Electronically Signed   By: Burman NievesWilliam   Stevens M.D.   On: 01/13/2018 01:46    ED COURSE and MDM  Nursing notes and initial vitals signs, including pulse oximetry, reviewed.  Vitals:   01/12/18 2143 01/12/18 2145  BP: (!) 134/92   Pulse: 86   Resp: 18   Temp: 98 F (36.7 C)   TempSrc: Oral   SpO2: 96%   Weight:  117.5 kg  Height:  5\' 3"  (1.6 m)   1:54 AM Patient advised of reassuring labs and CT scan.  History is consistent with gastroenteritis.  PROCEDURES    ED DIAGNOSES     ICD-10-CM   1. Gastroenteritis K52.9        Laporche Martelle, MD 01/13/18 [phone]

## 2018-01-13 MED ORDER — ONDANSETRON 8 MG PO TBDP: 8.0000 mg | ORAL_TABLET | Freq: Three times a day (TID) | ORAL | 0 refills | Status: DC | PRN

## 2018-01-13 MED ORDER — IOPAMIDOL (ISOVUE-300) INJECTION 61%
100.0000 mL | Freq: Once | INTRAVENOUS | Status: AC | PRN
  Administered 2018-01-13: 100 mL via INTRAVENOUS

## 2018-01-13 NOTE — ED Notes (Signed)
Pt verbalizes understanding of d/c instructions and denies any further needs at this time. 

## 2018-01-13 NOTE — ED Notes (Signed)
Patient transported to CT 

## 2018-01-13 NOTE — ED Notes (Signed)
Pt drinking contrast. 

## 2018-02-05 ENCOUNTER — Emergency Department (HOSPITAL_BASED_OUTPATIENT_CLINIC_OR_DEPARTMENT_OTHER)
Admission: EM | Admit: 2018-02-05 | Discharge: 2018-02-06 | Disposition: A | Discharge status: Home / Self Care | Payer: No Typology Code available for payment source | Attending: Emergency Medicine | Admitting: Emergency Medicine

## 2018-02-05 ENCOUNTER — Other Ambulatory Visit: Payer: Self-pay

## 2018-02-05 ENCOUNTER — Encounter (HOSPITAL_BASED_OUTPATIENT_CLINIC_OR_DEPARTMENT_OTHER): Payer: Self-pay | Admitting: *Deleted

## 2018-02-05 DIAGNOSIS — Y92410 Unspecified street and highway as the place of occurrence of the external cause: Secondary | ICD-10-CM | POA: Diagnosis not present

## 2018-02-05 DIAGNOSIS — Y9389 Activity, other specified: Secondary | ICD-10-CM | POA: Insufficient documentation

## 2018-02-05 DIAGNOSIS — Y999 Unspecified external cause status: Secondary | ICD-10-CM | POA: Insufficient documentation

## 2018-02-05 DIAGNOSIS — S199XXA Unspecified injury of neck, initial encounter: Secondary | ICD-10-CM | POA: Diagnosis present

## 2018-02-05 DIAGNOSIS — S161XXA Strain of muscle, fascia and tendon at neck level, initial encounter: Secondary | ICD-10-CM | POA: Diagnosis not present

## 2018-02-05 DIAGNOSIS — Z87891 Personal history of nicotine dependence: Secondary | ICD-10-CM | POA: Insufficient documentation
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
## 2018-02-06 MED ORDER — NAPROXEN 500 MG PO TABS: 500.0000 mg | ORAL_TABLET | Freq: Two times a day (BID) | ORAL | 0 refills | Status: DC

## 2018-02-06 MED ORDER — CYCLOBENZAPRINE HCL 5 MG PO TABS: 5.0000 mg | ORAL_TABLET | Freq: Two times a day (BID) | ORAL | 0 refills | Status: DC | PRN

## 2018-02-06 MED ORDER — KETOROLAC TROMETHAMINE 30 MG/ML IJ SOLN
30.0000 mg | Freq: Once | INTRAMUSCULAR | Status: AC
  Administered 2018-02-06: 30 mg via INTRAMUSCULAR
  Filled 2018-02-06: qty 1
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
## 2018-02-17 IMAGING — US US ABDOMEN LIMITED
1 series · 14 of 25 positions shown · non-contrast
Comparison: 09/30/2014 CT abdomen and pelvis

CLINICAL DATA: 31 y/o F; epigastric and right upper quadrant pain
for 10-12 hours.

EXAM:
ULTRASOUND ABDOMEN LIMITED RIGHT UPPER QUADRANT

[Series 1: us abdomen limited · 0.24mm/px · 14 of 45 slices shown]
[im 1/45]
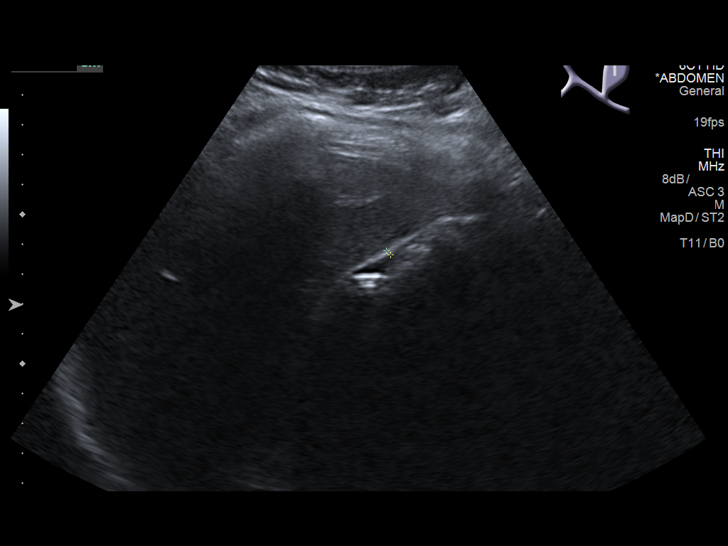
[im 4/45]
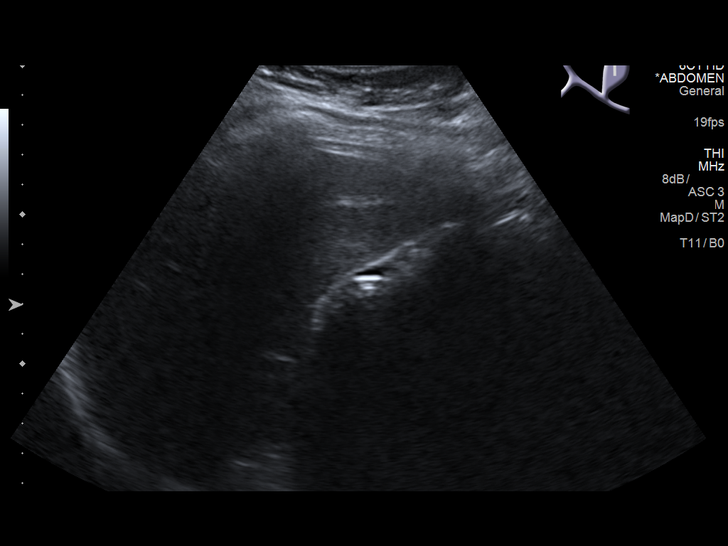
[im 8/45]
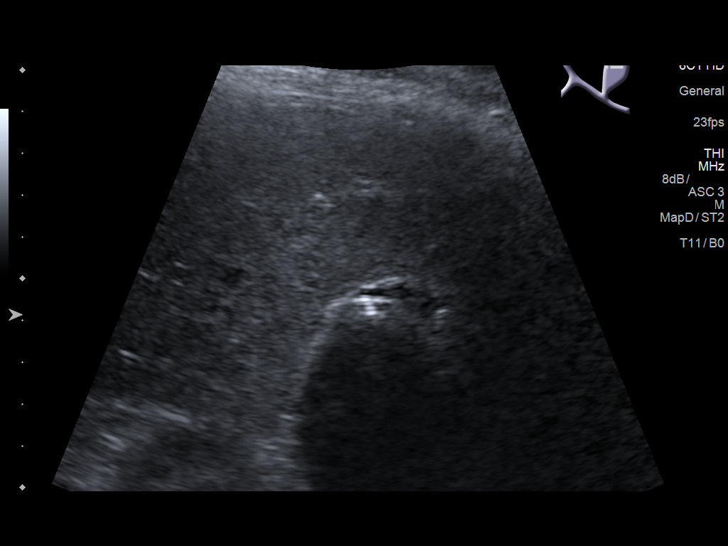
[im 12/45]
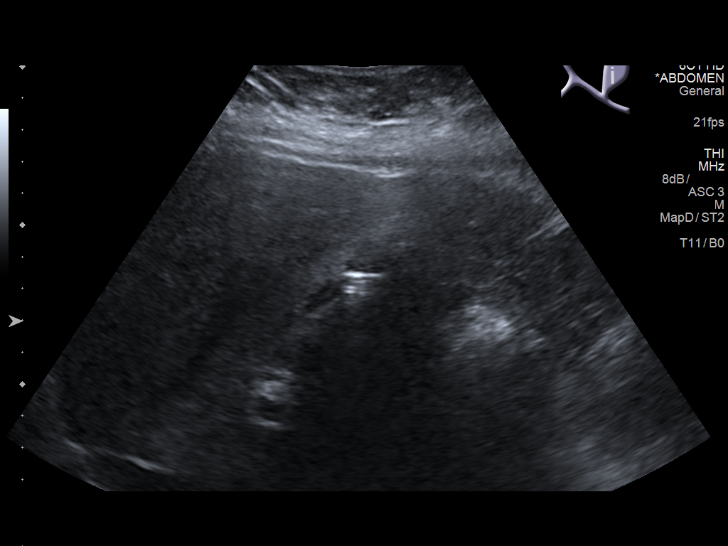
[im 15/45]
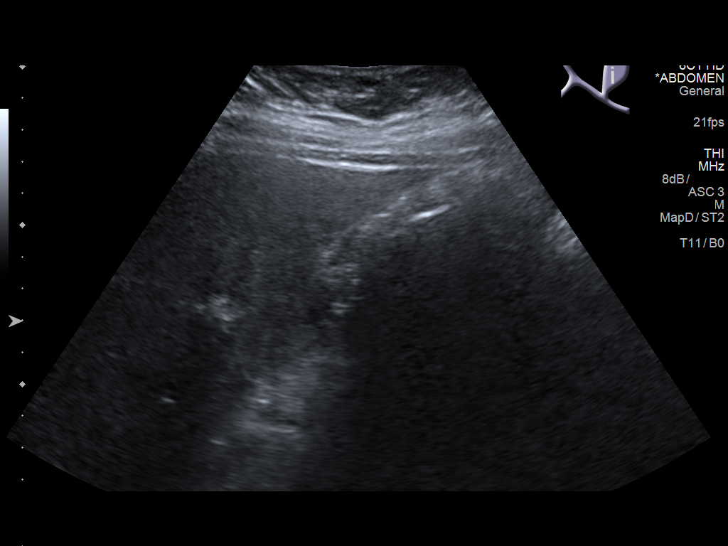
[im 17/45]
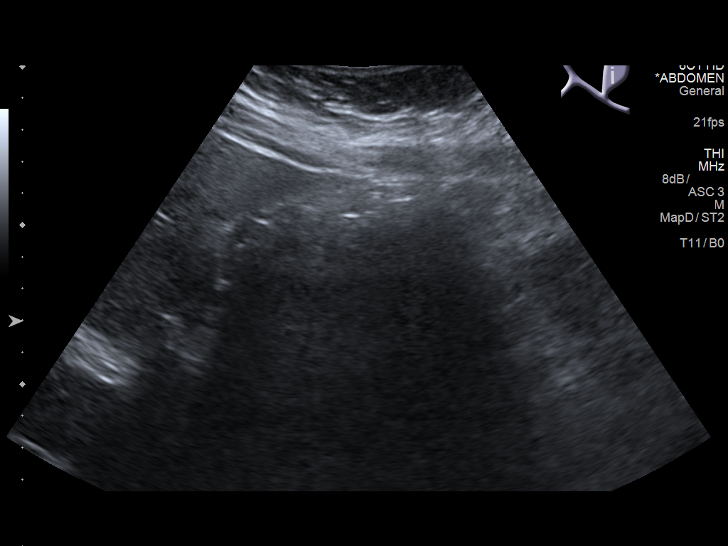
[im 21/45]
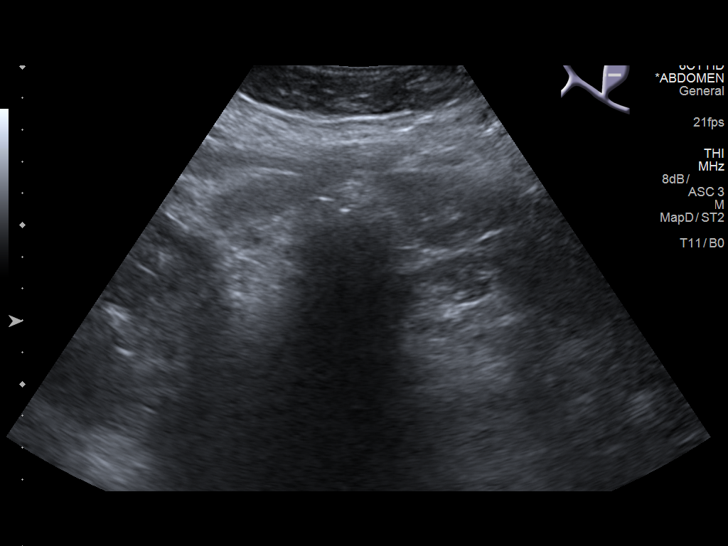
[im 24/45]
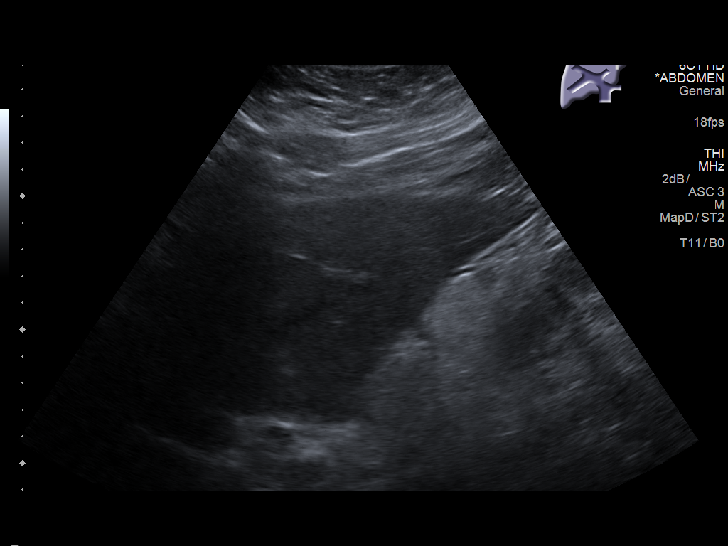
[im 28/45]
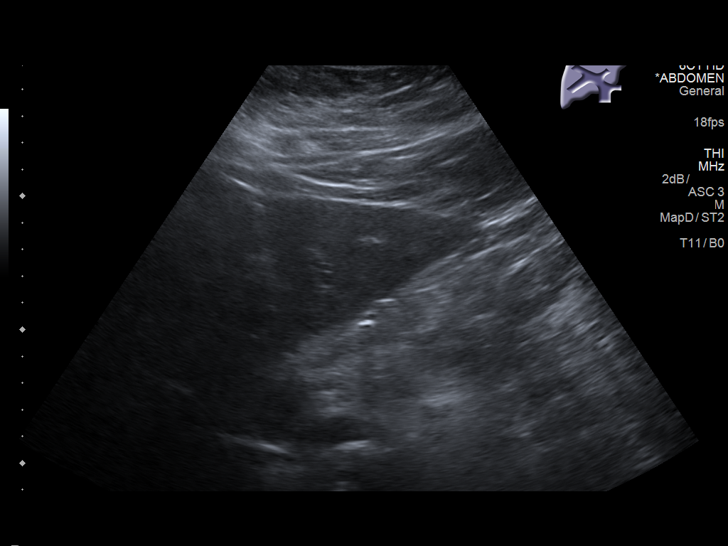
[im 30/45]
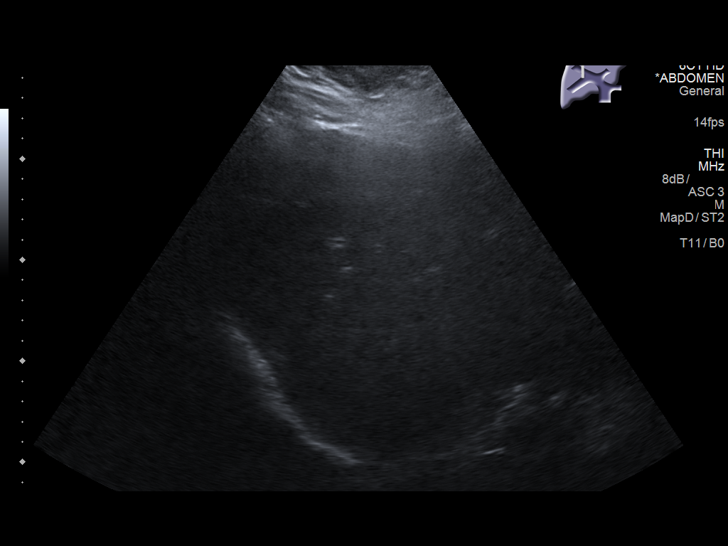
[im 34/45]
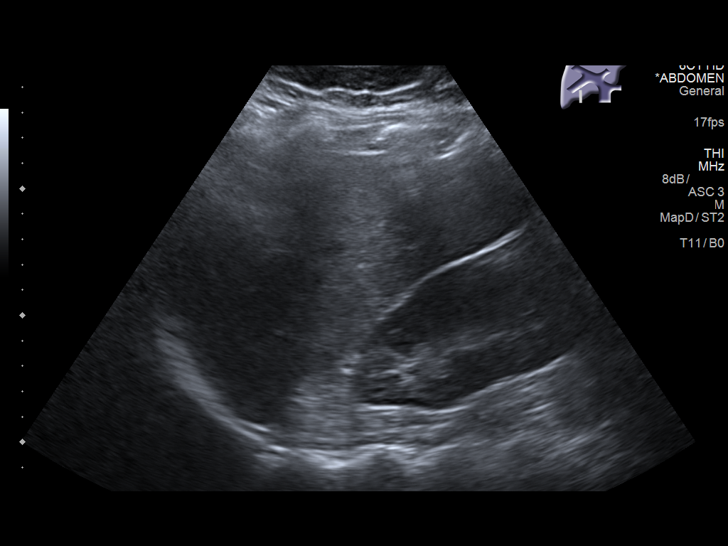
[im 37/45]
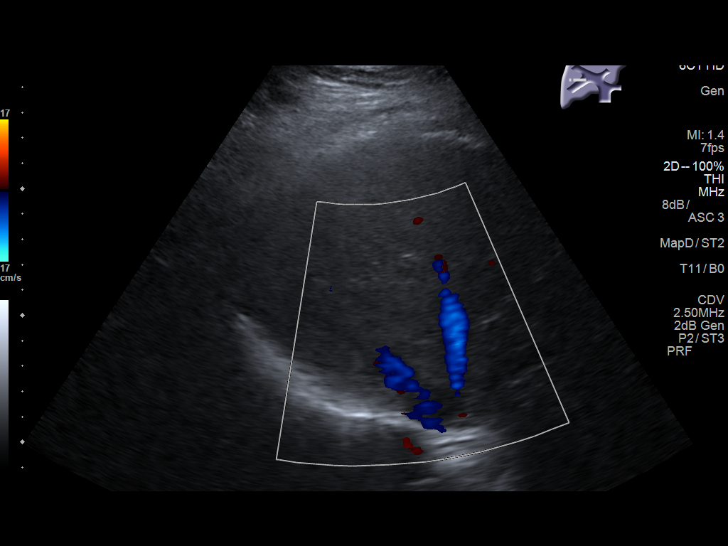
[im 41/45]
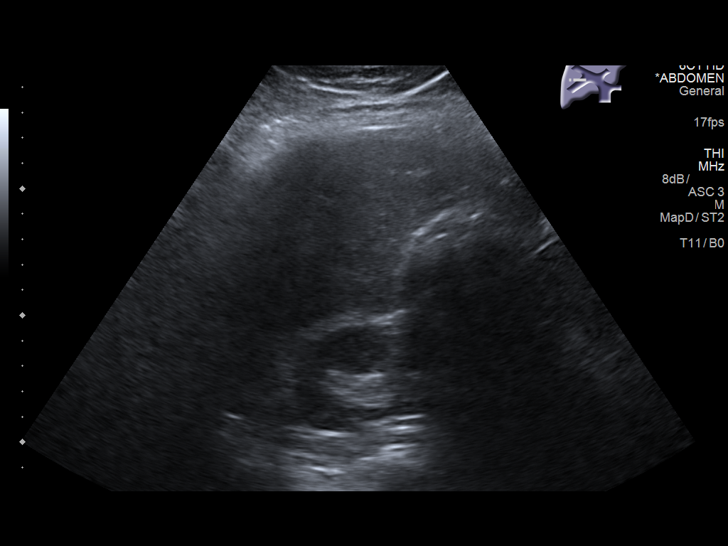
[im 45/45]
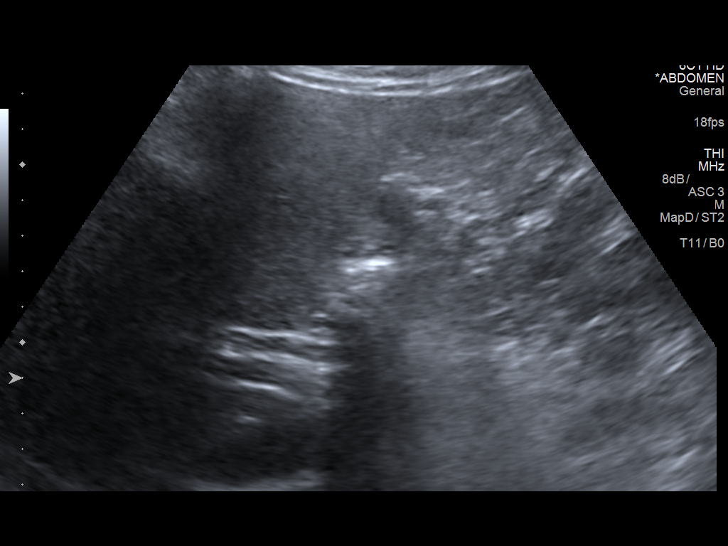

[14 of 25 positions shown; findings below may reference images not displayed]

FINDINGS: Gallbladder:

Wall echo shadow sign, poor visualization of the gallbladder.
Positive sonographic Murphy's sign. Pericholecystic fluid.

Common bile duct:

Diameter: 2.4 mm

Liver:

1.6 cm echogenic focus, likely hemangioma.. Within normal limits in
parenchymal echogenicity. Portal vein is patent on color Doppler
imaging with normal direction of blood flow towards the liver.
IMPRESSION: 1. Wall echo shadow sign, likely multiple gallstones. Positive
sonographic Murphy's sign. Pericholecystic fluid. Findings are
compatible with acute cholecystitis in the appropriate clinical
setting.
2. 1.6 cm echogenic focus in the liver, likely hemangioma.

By: Tuninha Oiteiro M.D.

## 2019-01-29 ENCOUNTER — Emergency Department (HOSPITAL_BASED_OUTPATIENT_CLINIC_OR_DEPARTMENT_OTHER)
Admission: EM | Admit: 2019-01-29 | Discharge: 2019-01-29 | Disposition: A | Discharge status: Home / Self Care | Payer: Self-pay | Attending: Emergency Medicine | Admitting: Emergency Medicine

## 2019-01-29 ENCOUNTER — Encounter (HOSPITAL_BASED_OUTPATIENT_CLINIC_OR_DEPARTMENT_OTHER): Payer: Self-pay

## 2019-01-29 ENCOUNTER — Other Ambulatory Visit: Payer: Self-pay

## 2019-01-29 DIAGNOSIS — R059 Cough, unspecified: Secondary | ICD-10-CM

## 2019-01-29 DIAGNOSIS — Z87891 Personal history of nicotine dependence: Secondary | ICD-10-CM | POA: Insufficient documentation

## 2019-01-29 DIAGNOSIS — J029 Acute pharyngitis, unspecified: Secondary | ICD-10-CM | POA: Insufficient documentation

## 2019-01-29 DIAGNOSIS — R05 Cough: Secondary | ICD-10-CM

## 2019-01-29 DIAGNOSIS — M791 Myalgia, unspecified site: Secondary | ICD-10-CM | POA: Insufficient documentation

## 2019-01-29 DIAGNOSIS — Z20828 Contact with and (suspected) exposure to other viral communicable diseases: Secondary | ICD-10-CM | POA: Insufficient documentation

## 2019-01-29 DIAGNOSIS — R52 Pain, unspecified: Secondary | ICD-10-CM

## 2019-01-29 NOTE — ED Provider Notes (Signed)
[facility] EMERGENCY DEPARTMENT Provider Note   CSN: [id number] Arrival date & time: 01/29/19  2028     History   Chief Complaint Chief Complaint  Patient presents with  . Cough    HPI Breanna English is a 33 y.o. female.     Patient presents to the emergency department with 5-day history of "flulike illness".  Patient developed a sore throat, generalized body aches, occasional cough with intermittent fevers at home.  She denies any nausea, vomiting, or diarrhea.  No urinary symptoms or skin rash.  Patient has had some chest tightness.  She states that she is noticed change in her sense of taste.  No known sick contacts or coronavirus contacts.  Patient states that several other family members are home sick with similar symptoms.  She is here with her daughter tonight because "We are the sickest".     Past Medical History:  Diagnosis Date  . Ovarian cyst     Patient Active Problem List   Diagnosis Date Noted  . Acute cholecystitis due to biliary calculus 06/29/2017    Past Surgical History:  Procedure Laterality Date  . CHOLECYSTECTOMY N/A 06/29/2017   Procedure: LAPAROSCOPIC CHOLECYSTECTOMY;  Surgeon: Kinsinger, De Blanch, MD;  Location: [facility] OR;  Service: General;  Laterality: N/A;  . LAPAROSCOPIC CHOLECYSTECTOMY  06/29/2017  . OVARIAN CYST REMOVAL  2002  . TUBAL LIGATION       OB History    Gravida  5   Para  3   Term      Preterm      AB      Living        SAB      TAB      Ectopic      Multiple      Live Births               Home Medications    Prior to Admission medications   Medication Sig Start Date End Date Taking? Authorizing Provider  cyclobenzaprine (FLEXERIL) 5 MG tablet Take 1 tablet (5 mg total) by mouth 2 (two) times daily as needed for muscle spasms. 02/06/18   Horton, Mayer Masker, MD  docusate sodium (COLACE) 100 MG capsule Take 1 capsule (100 mg total) by mouth 2 (two) times daily. 07/02/17   Meuth, Brooke A, PA-C   naproxen (NAPROSYN) 500 MG tablet Take 1 tablet (500 mg total) by mouth 2 (two) times daily. 02/06/18   Horton, Mayer Masker, MD  ondansetron (ZOFRAN ODT) 8 MG disintegrating tablet Take 1 tablet (8 mg total) by mouth every 8 (eight) hours as needed for nausea or vomiting. 01/13/18   Molpus, John, MD  polyethylene glycol (MIRALAX / GLYCOLAX) packet Take 17 g by mouth daily. 07/02/17   Meuth, Brooke A, PA-C  traMADol (ULTRAM) 50 MG tablet Take 1 tablet (50 mg total) by mouth every 6 (six) hours as needed (mild pain). 07/04/17   Jerre Simon, PA    Family History No family history on file.  Social History Social History   Tobacco Use  . Smoking status: Former Smoker    Types: Cigarettes    Quit date: 06/01/2015    Years since quitting: 3.6  . Smokeless tobacco: Never Used  Substance Use Topics  . Alcohol use: Not Currently  . Drug use: No     Allergies   Penicillins   Review of Systems Review of Systems  Constitutional: Positive for chills, fatigue and fever.  HENT: Positive for  sore throat. Negative for congestion, ear pain, rhinorrhea and sinus pressure.   Eyes: Negative for redness.  Respiratory: Positive for cough. Negative for wheezing.   Gastrointestinal: Negative for abdominal pain, diarrhea, nausea and vomiting.  Genitourinary: Negative for dysuria.  Musculoskeletal: Positive for myalgias. Negative for neck stiffness.  Skin: Negative for rash.  Neurological: Negative for headaches.  Hematological: Negative for adenopathy.     Physical Exam Updated Vital Signs BP 128/87 (BP Location: Left Arm)   Pulse 82   Temp 99 F (37.2 C) (Oral)   Resp 20   Ht 5\' 3"  (1.6 m)   Wt 118.4 kg   LMP 01/24/2019   SpO2 100%   BMI 46.23 kg/m   Physical Exam Vitals signs and nursing note reviewed.  Constitutional:      Appearance: She is well-developed.  HENT:     Head: Normocephalic and atraumatic.     Jaw: No trismus.     Right Ear: Tympanic membrane, ear canal and external  ear normal.     Left Ear: Tympanic membrane, ear canal and external ear normal.     Nose: Nose normal. No mucosal edema or rhinorrhea.     Mouth/Throat:     Mouth: Mucous membranes are not dry. No oral lesions.     Pharynx: Uvula midline. No oropharyngeal exudate, posterior oropharyngeal erythema or uvula swelling.     Tonsils: No tonsillar abscesses.  Eyes:     General:        Right eye: No discharge.        Left eye: No discharge.     Conjunctiva/sclera: Conjunctivae normal.  Neck:     Musculoskeletal: Normal range of motion and neck supple.  Cardiovascular:     Rate and Rhythm: Normal rate and regular rhythm.     Heart sounds: Normal heart sounds.  Pulmonary:     Effort: Pulmonary effort is normal. No respiratory distress.     Breath sounds: Normal breath sounds. No wheezing or rales.  Abdominal:     Palpations: Abdomen is soft.     Tenderness: There is no abdominal tenderness.  Lymphadenopathy:     Cervical: No cervical adenopathy.  Skin:    General: Skin is warm and dry.  Neurological:     Mental Status: She is alert.      ED Treatments / Results  Labs (all labs ordered are listed, but only abnormal results are displayed) Labs Reviewed  SARS CORONAVIRUS 2 (TAT 6-24 HRS)    EKG None  Radiology No results found.  Procedures Procedures (including critical care time)  Medications Ordered in ED Medications - No data to display   Initial Impression / Assessment and Plan / ED Course  I have reviewed the triage vital signs and the nursing notes.  Pertinent labs & imaging results that were available during my care of the patient were reviewed by me and considered in my medical decision making (see chart for details).        Patient seen and examined.  Will obtain coronavirus testing given patient's symptoms.  She appears well and there are no indications for further work-up.  No hypoxia, tachycardia, or other concerning symptoms at this time.  They will  continue isolation until results return tomorrow and follow-up if symptoms worsen.  Vital signs reviewed and are as follows: BP 128/87 (BP Location: Left Arm)   Pulse 82   Temp 99 F (37.2 C) (Oral)   Resp 20   Ht 5\' 3"  (1.6 m)  Wt 118.4 kg   LMP 01/24/2019   SpO2 100%   BMI 46.23 kg/m   10:34 PM Patient counseled on supportive care for viral URI and s/s to return including worsening symptoms, persistent fever, persistent vomiting, or if they have any other concerns. Urged to see PCP if symptoms persist for more than 3 days. Patient verbalizes understanding and agrees with plan.    Final Clinical Impressions(s) / ED Diagnoses   Final diagnoses:  Sore throat  Body aches  Cough   Patient with symptoms consistent with a viral syndrome. ? COVID.  Testing pending.  Vitals are stable, no fever. No signs of dehydration. Lung exam normal, no signs of pneumonia. Supportive therapy indicated with return if symptoms worsen.     ED Discharge Orders    None       Renne CriglerGeiple, Chijioke Lasser, Cordelia Poche-C 01/29/19 2235    Rolan BuccoBelfi, Melanie, MD 01/29/19 2239
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
## 2019-01-29 NOTE — Discharge Instructions (Signed)
Please read and follow all provided instructions.  Your diagnoses today include:  1. Sore throat   2. Body aches   3. Cough     You appear to have an upper respiratory infection (URI). An upper respiratory tract infection, or cold, is a viral infection of the air passages leading to the lungs. It should improve gradually after 5-7 days. You may have a lingering cough that lasts for 2- 4 weeks after the infection.  Tests performed today include:  Vital signs. See below for your results today.   Coronavirus testing-we will be back in the next day.  Please check mychart for results.  Medications prescribed:  Please use over-the-counter NSAID medications (ibuprofen, naproxen) as directed on the packaging for pain.   Take any prescribed medications only as directed. Treatment for your infection is aimed at treating the symptoms. There are no medications, such as antibiotics, that will cure your infection.   Home care instructions:  Follow any educational materials contained in this packet.   Your illness is contagious and can be spread to others, especially during the first 3 or 4 days. It cannot be cured by antibiotics or other medicines. Take basic precautions such as washing your hands often, covering your mouth when you cough or sneeze, and avoiding public places where you could spread your illness to others.   Please continue drinking plenty of fluids.  Use over-the-counter medicines as needed as directed on packaging for symptom relief.  You may also use ibuprofen or tylenol as directed on packaging for pain or fever.  Do not take multiple medicines containing Tylenol or acetaminophen to avoid taking too much of this medication.  Follow-up instructions: Please follow-up with your primary care provider in the next 3 days for further evaluation of your symptoms if you are not feeling better.   Return instructions:   Please return to the Emergency Department if you experience worsening  symptoms.   RETURN IMMEDIATELY IF you develop shortness of breath, confusion or altered mental status, a new rash, become dizzy, faint, or poorly responsive, or are unable to be cared for at home.  Please return if you have persistent vomiting and cannot keep down fluids or develop a fever that is not controlled by tylenol or motrin.    Please return if you have any other emergent concerns.  Additional Information:  Your vital signs today were: BP 128/87 (BP Location: Left Arm)    Pulse 82    Temp 99 F (37.2 C) (Oral)    Resp 20    Ht 5\' 3"  (1.6 m)    Wt 118.4 kg    LMP 01/24/2019    SpO2 100%    BMI 46.23 kg/m  If your blood pressure (BP) was elevated above 135/85 this visit, please have this repeated by your doctor within one month. --------------

## 2019-01-30 LAB — SARS CORONAVIRUS 2 (TAT 6-24 HRS): SARS Coronavirus 2: NEGATIVE

## 2019-12-12 IMAGING — DX DG ABD PORTABLE 1V
1 series · 1 of 1 positions shown · non-contrast
Comparison: Abdominal CT 09/30/2014

CLINICAL DATA: Ileus

EXAM:
PORTABLE ABDOMEN - 1 VIEW

[abdomen]
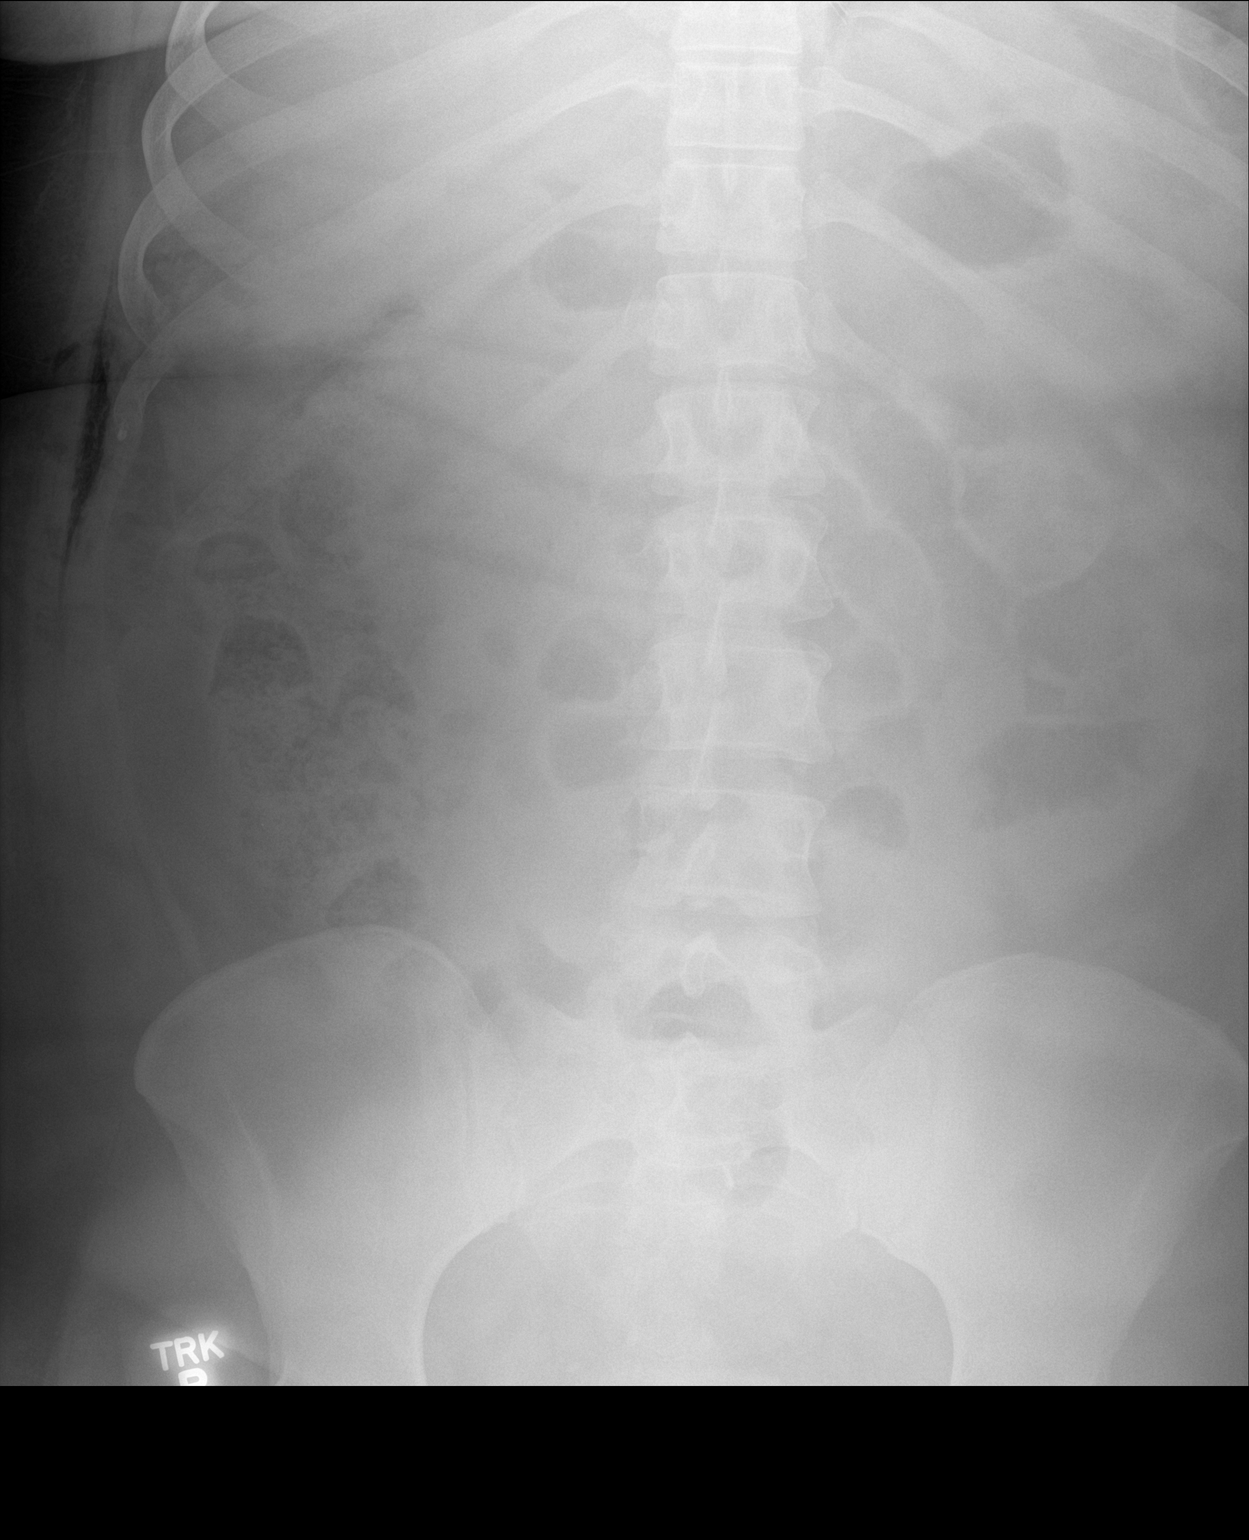

[1 of 1 positions shown; findings below may reference images not displayed]

FINDINGS: Few loops of small bowel containing gas. No over distention for
obstruction or definite ileus. No excessive stool retention. No
concerning mass effect or gas collection. No definite urolithiasis,
pelvic calcifications correlating with phleboliths on 09/30/2014
abdominal CT.
IMPRESSION: Normal bowel gas pattern. If progressive symptoms a follow-up could
be obtained.

## 2020-06-25 IMAGING — CT CT ABD-PELV W/ CM
2 of 4 series · 17 of 46 positions shown, 19 images · IV contrast (APPLIED)
Comparison: 09/30/2014

CLINICAL DATA: Left lower quadrant and left abdominal pain for 4
days. Nausea, vomiting, and diarrhea.

EXAM:
CT ABDOMEN AND PELVIS WITH CONTRAST
TECHNIQUE: Multidetector CT imaging of the abdomen and pelvis was performed
using the standard protocol following bolus administration of
intravenous contrast.
CONTRAST:  100mL DARRKX-LII IOPAMIDOL (DARRKX-LII) INJECTION 61%

[Series 3: axial st · axial · 0.98mm/px · z∈[-476,-1]mm · 14 of 105 slices shown, 16 images]
[im 5/105  soft-tissue]
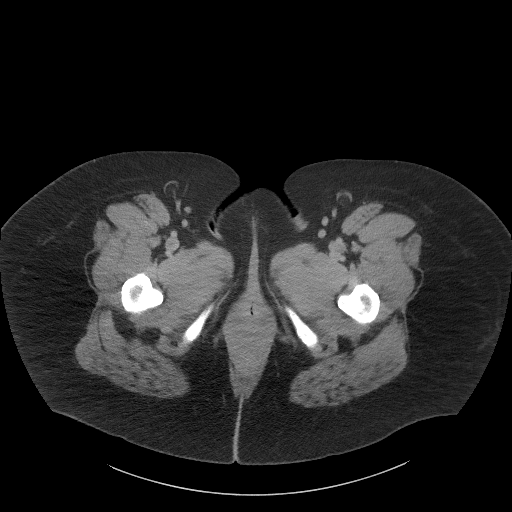
[im 5/105  bone]
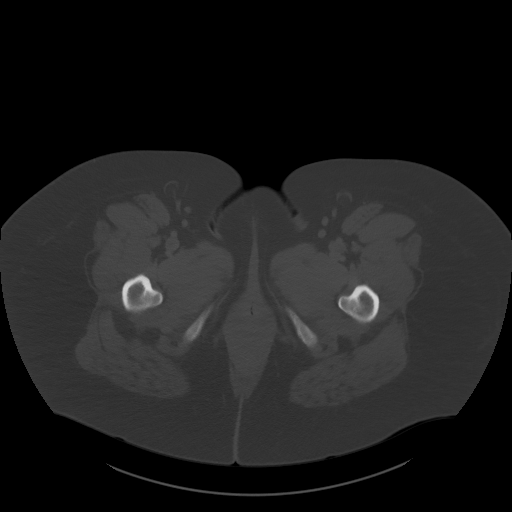
[im 14/105  soft-tissue]
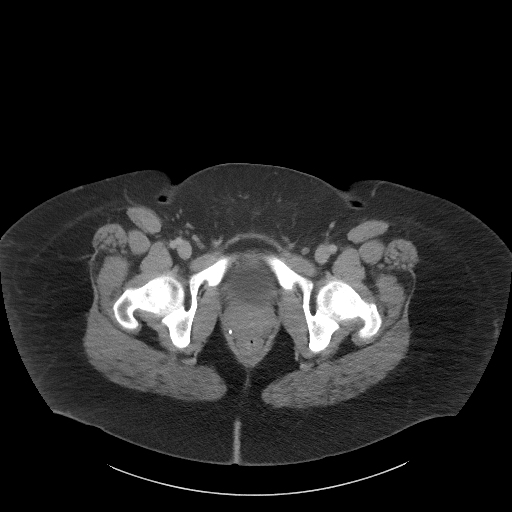
[im 19/105  soft-tissue]
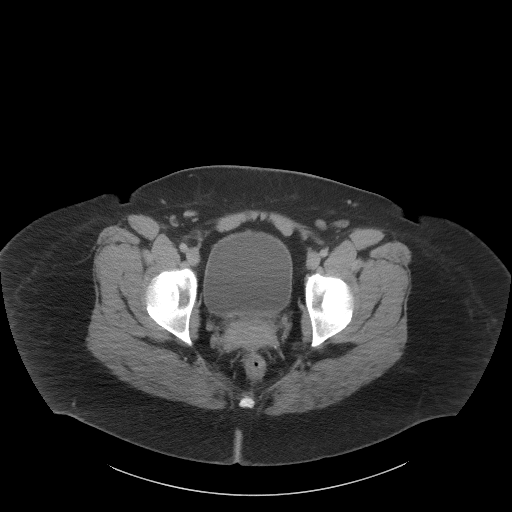
[im 28/105  soft-tissue]
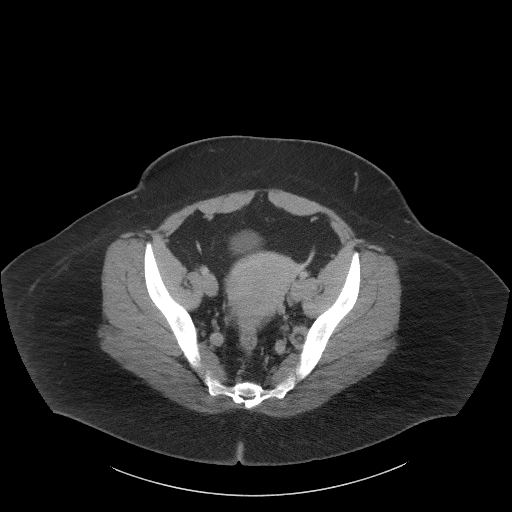
[im 37/105  soft-tissue]
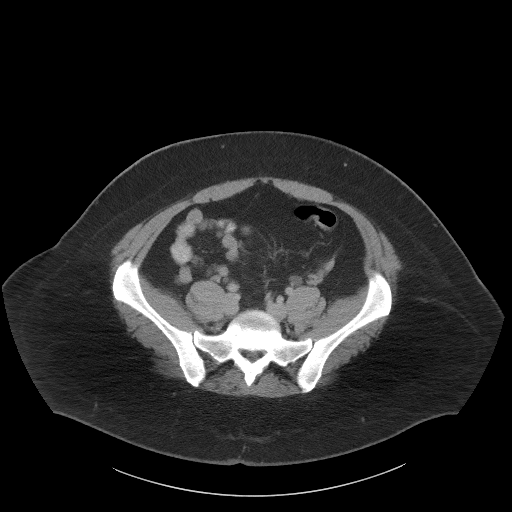
[im 41/105  soft-tissue]
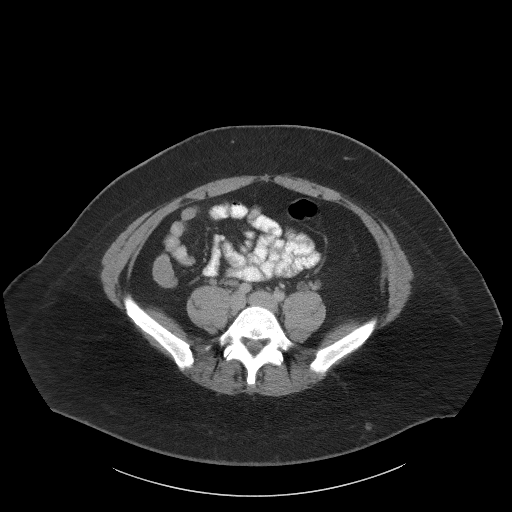
[im 50/105  soft-tissue]
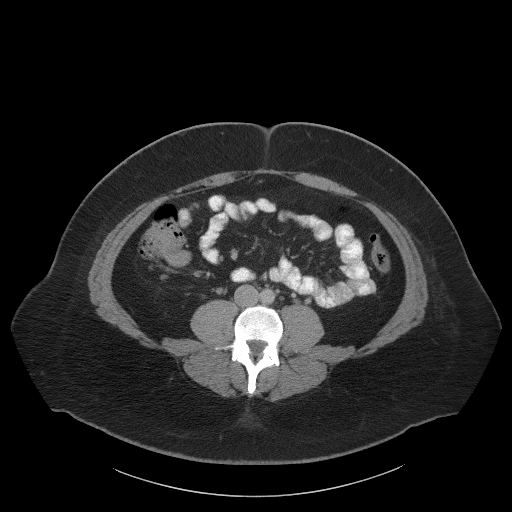
[im 55/105  soft-tissue]
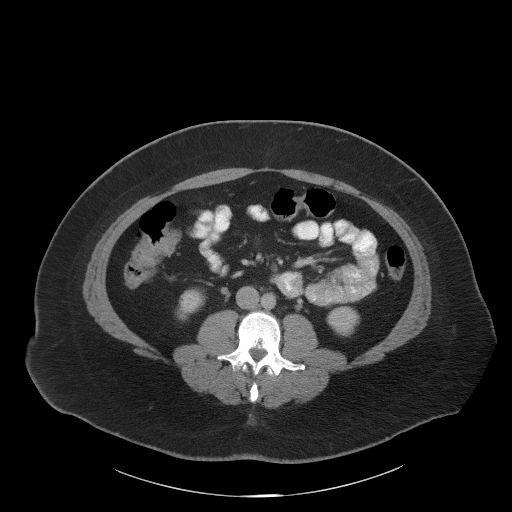
[im 64/105  soft-tissue]
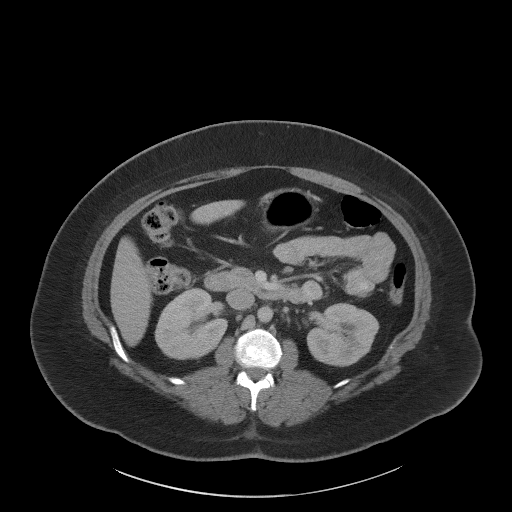
[im 64/105  bone]
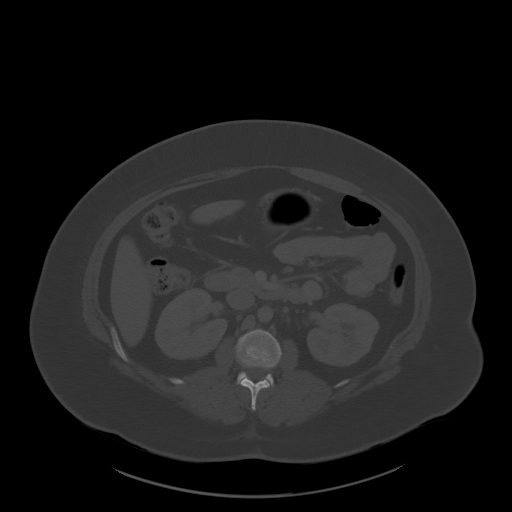
[im 68/105  soft-tissue]
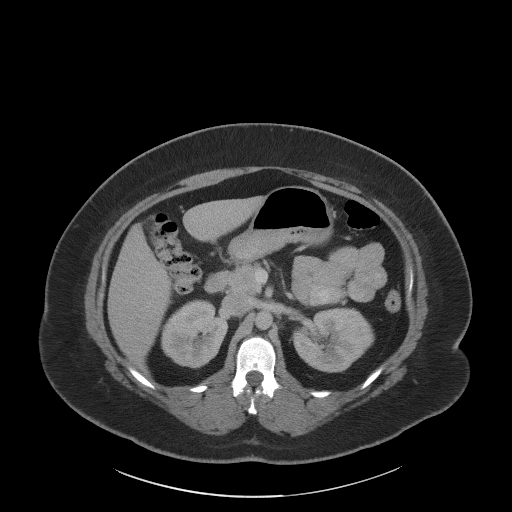
[im 77/105  soft-tissue]
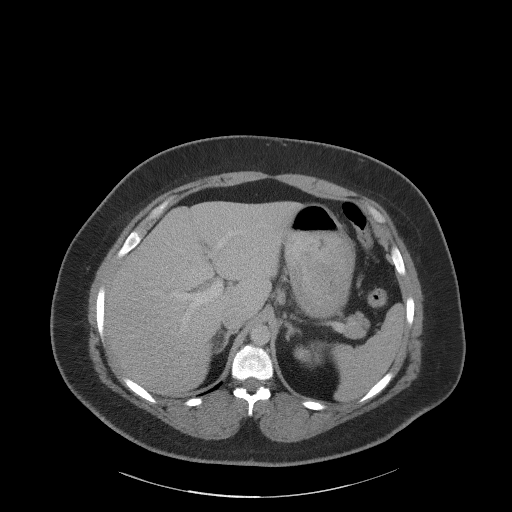
[im 86/105  soft-tissue]
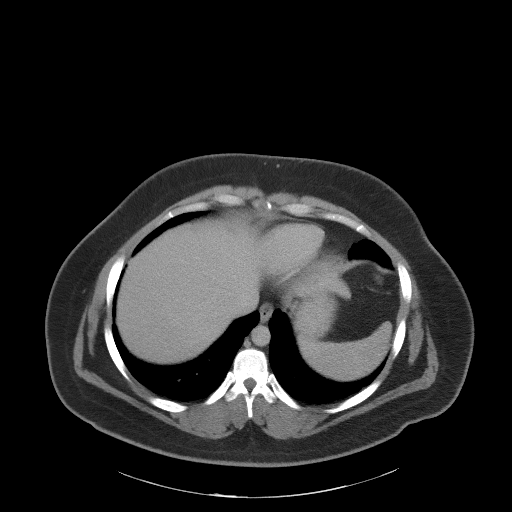
[im 91/105  soft-tissue]
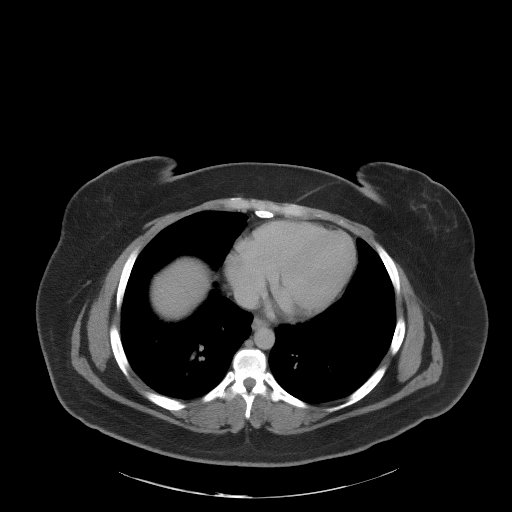
[im 100/105  soft-tissue]
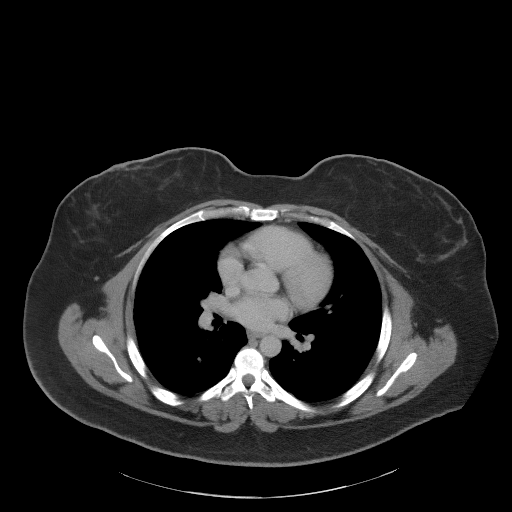

[Series 6: coronal st · coronal · 0.90mm/px · 3 of 97 slices shown]
[im 33/97  soft-tissue]
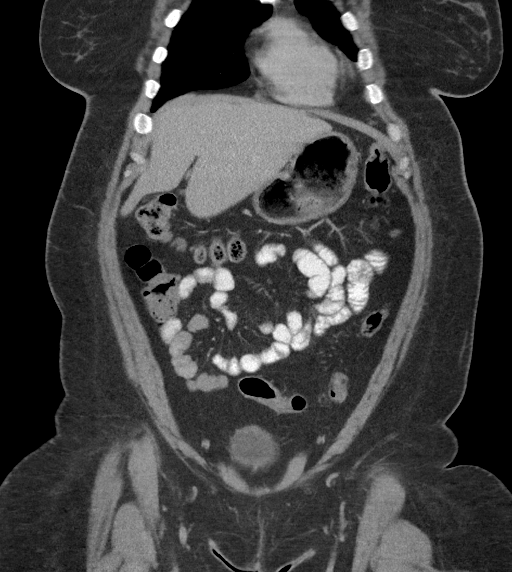
[im 43/97  soft-tissue]
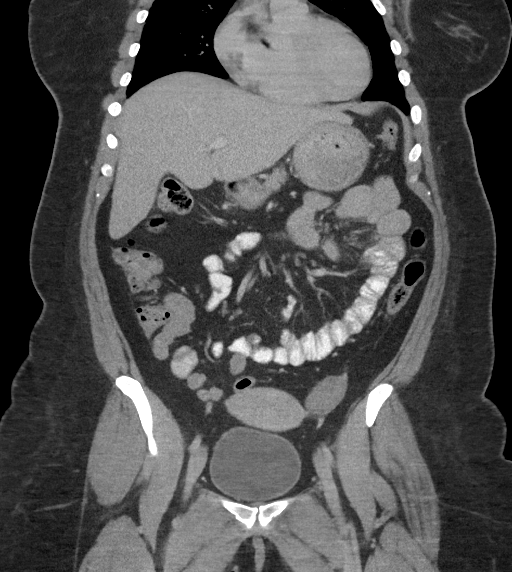
[im 54/97  soft-tissue]
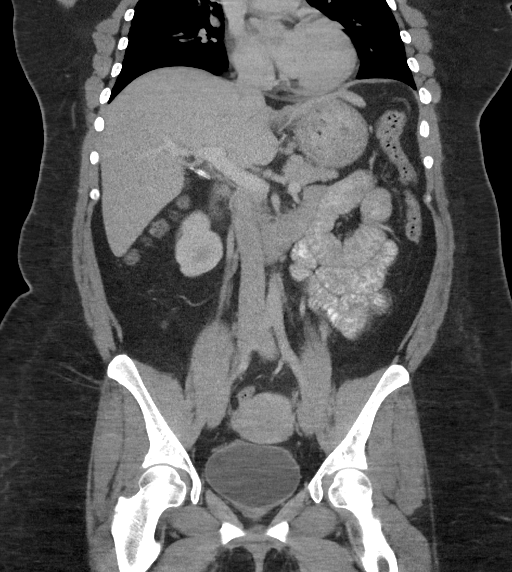

[17 of 46 positions shown; findings below may reference images not displayed]

FINDINGS: Lower chest: Lung bases are clear.

Hepatobiliary: No focal liver abnormality is seen. Status post
cholecystectomy. No biliary dilatation.

Pancreas: Unremarkable. No pancreatic ductal dilatation or
surrounding inflammatory changes.

Spleen: Normal in size without focal abnormality.

Adrenals/Urinary Tract: Adrenal glands are unremarkable. Kidneys are
normal, without renal calculi, focal lesion, or hydronephrosis.
Bladder is unremarkable.

Stomach/Bowel: Stomach is within normal limits. Appendix appears
normal. No evidence of bowel wall thickening, distention, or
inflammatory changes.

Vascular/Lymphatic: No significant vascular findings are present. No
enlarged abdominal or pelvic lymph nodes.

Reproductive: Uterus and bilateral adnexa are unremarkable.

Other: No abdominal wall hernia or abnormality. No abdominopelvic
ascites.

Musculoskeletal: No acute or significant osseous findings.
IMPRESSION: No acute process demonstrated in the abdomen or pelvis. No evidence
of bowel obstruction or inflammation.

## 2020-11-19 ENCOUNTER — Encounter: Payer: Self-pay | Admitting: Emergency Medicine

## 2020-11-19 ENCOUNTER — Other Ambulatory Visit: Payer: Self-pay

## 2020-11-19 ENCOUNTER — Ambulatory Visit
Admission: EM | Admit: 2020-11-19 | Discharge: 2020-11-19 | Disposition: A | Discharge status: Home / Self Care | Payer: Medicaid Other | Attending: Sports Medicine | Admitting: Sports Medicine

## 2020-11-19 DIAGNOSIS — J028 Acute pharyngitis due to other specified organisms: Secondary | ICD-10-CM

## 2020-11-19 DIAGNOSIS — H9201 Otalgia, right ear: Secondary | ICD-10-CM | POA: Diagnosis not present

## 2020-11-19 DIAGNOSIS — J069 Acute upper respiratory infection, unspecified: Secondary | ICD-10-CM

## 2020-11-19 DIAGNOSIS — Z880 Allergy status to penicillin: Secondary | ICD-10-CM | POA: Insufficient documentation

## 2020-11-19 DIAGNOSIS — J029 Acute pharyngitis, unspecified: Secondary | ICD-10-CM | POA: Diagnosis not present

## 2020-11-19 DIAGNOSIS — R0981 Nasal congestion: Secondary | ICD-10-CM

## 2020-11-19 DIAGNOSIS — R509 Fever, unspecified: Secondary | ICD-10-CM | POA: Insufficient documentation

## 2020-11-19 DIAGNOSIS — R059 Cough, unspecified: Secondary | ICD-10-CM | POA: Insufficient documentation

## 2020-11-19 DIAGNOSIS — Z20822 Contact with and (suspected) exposure to covid-19: Secondary | ICD-10-CM | POA: Diagnosis not present

## 2020-11-19 DIAGNOSIS — H6501 Acute serous otitis media, right ear: Secondary | ICD-10-CM | POA: Insufficient documentation

## 2020-11-19 DIAGNOSIS — B9789 Other viral agents as the cause of diseases classified elsewhere: Secondary | ICD-10-CM

## 2020-11-19 LAB — GROUP A STREP BY PCR: Group A Strep by PCR: NOT DETECTED

## 2020-11-19 LAB — RESP PANEL BY RT-PCR (FLU A&B, COVID) ARPGX2
Influenza A by PCR: NEGATIVE
Influenza B by PCR: NEGATIVE
SARS Coronavirus 2 by RT PCR: NEGATIVE

## 2020-11-19 MED ORDER — FLUTICASONE PROPIONATE 50 MCG/ACT NA SUSP: 2.0000 | Freq: Every day | NASAL | 0 refills | Status: DC

## 2020-11-19 MED ORDER — AZITHROMYCIN 250 MG PO TABS: 250.0000 mg | ORAL_TABLET | Freq: Every day | ORAL | 0 refills | Status: DC

## 2020-11-19 NOTE — ED Triage Notes (Signed)
Pt presents today with c/o of nasal congestion, sore throat, headache, cough and fever that began 5 days ago.   Family tested positive for RSV one week ago.

## 2020-11-19 NOTE — ED Provider Notes (Signed)
[facility]    CSN: [id number] Arrival date & time: 11/19/20  1332      History   Chief Complaint Chief Complaint  Patient presents with   Nasal Congestion   Cough   Fever    HPI Breanna English is a 35 y.o. female.   Patient is a pleasant 35 year old female who presents for evaluation of the above issue.  She does not have a primary care provider.  She stays at home and does not work outside the home.  She reports 5 days of cough, congestion, increased mucus production, sore throat, ear pressure, postnasal drip.  She denies any urinary or abdominal symptoms.  She has been exposed to RSV from her children.  No COVID or influenza exposure.  She has been vaccinated against COVID and has received the booster.  She also reports a low-grade fever.  She has been using over-the-counter Mucinex.  She also reports some chills.  She also reports some pain in her right ear.  She has not received her flu shot.  No chest pain or shortness of breath.  No red flag signs or symptoms elicited on history.    Past Medical History:  Diagnosis Date   Ovarian cyst     Patient Active Problem List   Diagnosis Date Noted   Acute cholecystitis due to biliary calculus 06/29/2017    Past Surgical History:  Procedure Laterality Date   CHOLECYSTECTOMY N/A 06/29/2017   Procedure: LAPAROSCOPIC CHOLECYSTECTOMY;  Surgeon: Kinsinger, De Blanch, MD;  Location: [facility] OR;  Service: General;  Laterality: N/A;   LAPAROSCOPIC CHOLECYSTECTOMY  06/29/2017   OVARIAN CYST REMOVAL  2002   TUBAL LIGATION      OB History     Gravida  5   Para  3   Term      Preterm      AB      Living         SAB      IAB      Ectopic      Multiple      Live Births               Home Medications    Prior to Admission medications   Medication Sig Start Date End Date Taking? Authorizing Provider  azithromycin (ZITHROMAX) 250 MG tablet Take 1 tablet (250 mg total) by mouth daily. Take first 2  tablets together, then 1 every day until finished. 11/19/20  Yes Delton See, MD  fluticasone [facility]) 50 MCG/ACT nasal spray Place 2 sprays into both nostrils daily. 11/19/20  Yes Delton See, MD  cyclobenzaprine (FLEXERIL) 5 MG tablet Take 1 tablet (5 mg total) by mouth 2 (two) times daily as needed for muscle spasms. 02/06/18   Horton, Mayer Masker, MD  docusate sodium (COLACE) 100 MG capsule Take 1 capsule (100 mg total) by mouth 2 (two) times daily. 07/02/17   Meuth, Brooke A, PA-C  naproxen (NAPROSYN) 500 MG tablet Take 1 tablet (500 mg total) by mouth 2 (two) times daily. 02/06/18   Horton, Mayer Masker, MD  ondansetron (ZOFRAN ODT) 8 MG disintegrating tablet Take 1 tablet (8 mg total) by mouth every 8 (eight) hours as needed for nausea or vomiting. 01/13/18   Molpus, John, MD  polyethylene glycol (MIRALAX / GLYCOLAX) packet Take 17 g by mouth daily. 07/02/17   Meuth, Brooke A, PA-C  traMADol (ULTRAM) 50 MG tablet Take 1 tablet (50 mg total) by mouth every 6 (six) hours as needed (  mild pain). 07/04/17   Jerre Simon, PA    Family History History reviewed. No pertinent family history.  Social History Social History   Tobacco Use   Smoking status: Former    Pack years: 0.00    Types: Cigarettes    Quit date: 06/01/2015    Years since quitting: 5.4   Smokeless tobacco: Never  Vaping Use   Vaping Use: Never used  Substance Use Topics   Alcohol use: Not Currently   Drug use: No     Allergies   Penicillins   Review of Systems Review of Systems  Constitutional:  Positive for chills and fever. Negative for activity change, appetite change, diaphoresis and fatigue.  HENT:  Positive for congestion, ear pain, postnasal drip and sore throat. Negative for ear discharge, rhinorrhea, sinus pressure, sinus pain and sneezing.   Eyes:  Negative for pain.  Respiratory:  Positive for cough. Negative for chest tightness and shortness of breath.   Cardiovascular:  Negative for chest pain and  palpitations.  Gastrointestinal:  Negative for abdominal pain, diarrhea, nausea and vomiting.  Genitourinary:  Negative for dysuria.  Musculoskeletal:  Positive for myalgias. Negative for back pain and neck pain.  Skin:  Negative for color change, pallor, rash and wound.  Neurological:  Negative for dizziness, light-headedness, numbness and headaches.  All other systems reviewed and are negative.   Physical Exam Triage Vital Signs ED Triage Vitals  Enc Vitals Group     BP 11/19/20 1349 136/76     Pulse Rate 11/19/20 1349 (!) 58     Resp 11/19/20 1349 18     Temp 11/19/20 1349 98.5 F (36.9 C)     Temp Source 11/19/20 1349 Oral     SpO2 11/19/20 1349 100 %     Weight --      Height --      Head Circumference --      Peak Flow --      Pain Score 11/19/20 1346 10     Pain Loc --      Pain Edu? --      Excl. in GC? --    No data found.  Updated Vital Signs BP 136/76 (BP Location: Left Arm)   Pulse (!) 58   Temp 98.5 F (36.9 C) (Oral)   Resp 18   LMP 11/08/2020 (Exact Date)   SpO2 100%   Visual Acuity Right Eye Distance:   Left Eye Distance:   Bilateral Distance:    Right Eye Near:   Left Eye Near:    Bilateral Near:     Physical Exam Vitals and nursing note reviewed.  Constitutional:      General: She is not in acute distress.    Appearance: Normal appearance. She is not ill-appearing, toxic-appearing or diaphoretic.  HENT:     Head: Normocephalic and atraumatic.     Right Ear: Hearing normal. Tenderness present. Tympanic membrane is erythematous and bulging.     Left Ear: Hearing and tympanic membrane normal.     Nose: Congestion present.     Mouth/Throat:     Mouth: Mucous membranes are moist.     Pharynx: Posterior oropharyngeal erythema present. No oropharyngeal exudate.  Eyes:     General: No scleral icterus.       Right eye: No discharge.        Left eye: No discharge.     Extraocular Movements: Extraocular movements intact.      Conjunctiva/sclera: Conjunctivae normal.  Pupils: Pupils are equal, round, and reactive to light.  Cardiovascular:     Rate and Rhythm: Normal rate and regular rhythm.     Pulses: Normal pulses.     Heart sounds: Normal heart sounds. No murmur heard.   No friction rub. No gallop.  Pulmonary:     Effort: Pulmonary effort is normal.     Breath sounds: Normal breath sounds. No stridor. No wheezing, rhonchi or rales.  Musculoskeletal:     Cervical back: Normal range of motion and neck supple.  Skin:    General: Skin is warm and dry.     Capillary Refill: Capillary refill takes less than 2 seconds.  Neurological:     General: No focal deficit present.     Mental Status: She is alert and oriented to person, place, and time.     UC Treatments / Results  Labs (all labs ordered are listed, but only abnormal results are displayed) Labs Reviewed  GROUP A STREP BY PCR  RESP PANEL BY RT-PCR (FLU A&B, COVID) [id number]    EKG   Radiology No results found.  Procedures Procedures (including critical care time)  Medications Ordered in UC Medications - No data to display  Initial Impression / Assessment and Plan / UC Course  I have reviewed the triage vital signs and the nursing notes.  Pertinent labs & imaging results that were available during my care of the patient were reviewed by me and considered in my medical decision making (see chart for details).  Clinical impression: 1.  Right otitis media with right ear pain 2.  Viral URI with cough. 3.  Nasal congestion 4.  Pharyngitis  Treatment plan: 1.  The findings and treatment plan were discussed in detail with the patient.  Patient was in agreement. 2.  Given her symptoms I recommended getting a strep test.  It was negative. 3.  Recommended getting a respiratory panel.  It was negative for COVID and influenza. 4.  Clinically she does have a right otitis media and I went ahead and prescribed her azithromycin as she does have a  penicillin allergy. 5.  Asked her to continue with Mucinex over-the-counter for her congestion.  She is not wheezing.  No indication to give her an inhaler.  I did send in a prescription for Flonase for her nasal congestion. 6.  Plenty of rest, plenty fluids, Tylenol or Motrin for any fever or discomfort. 7.  If symptoms persist I have asked her to see her PCP. 8.  If symptoms were to worsen she needs to go to the ER. 9.  Educational handouts were provided. 10.  She was stable on discharge and she will follow-up here as needed.    Final Clinical Impressions(s) / UC Diagnoses   Final diagnoses:  Viral URI with cough  Non-recurrent acute serous otitis media of right ear  Nasal congestion  Right ear pain  Sore throat (viral)     Discharge Instructions      As we discussed, you do have a right ear infection.  I prescribed antibiotics. Continue with the Mucinex over-the-counter.  You can also use over-the-counter cough medication.  You are not wheezing so there is no indication for an inhaler. I also prescribed a nasal steroid that he can pick up at the pharmacy which will help with your congestion. Plenty of rest, plenty of fluids, Tylenol or Motrin for any fever or discomfort. If your symptoms worsen then please go to the ER. If your symptoms persist  please see your primary care provider. Please see educational handouts.     ED Prescriptions     Medication Sig Dispense Auth. Provider   azithromycin (ZITHROMAX) 250 MG tablet Take 1 tablet (250 mg total) by mouth daily. Take first 2 tablets together, then 1 every day until finished. 6 tablet Delton See, MD   fluticasone [facility]) 50 MCG/ACT nasal spray Place 2 sprays into both nostrils daily. 15.8 mL Delton See, MD      PDMP not reviewed this encounter.   Delton See, MD 11/19/20 2017

## 2020-11-19 NOTE — Discharge Instructions (Addendum)
As we discussed, you do have a right ear infection.  I prescribed antibiotics. Continue with the Mucinex over-the-counter.  You can also use over-the-counter cough medication.  You are not wheezing so there is no indication for an inhaler. I also prescribed a nasal steroid that he can pick up at the pharmacy which will help with your congestion. Plenty of rest, plenty of fluids, Tylenol or Motrin for any fever or discomfort. If your symptoms worsen then please go to the ER. If your symptoms persist please see your primary care provider. Please see educational handouts.

## 2020-11-20 ENCOUNTER — Telehealth: Payer: Self-pay

## 2020-11-20 MED ORDER — AZITHROMYCIN 250 MG PO TABS: 250.0000 mg | ORAL_TABLET | Freq: Every day | ORAL | 0 refills | Status: DC

## 2020-11-20 NOTE — Telephone Encounter (Signed)
Pt called and states her children threw away her abx she received yesterday. Pt asking for another rx to be sent to pharmacy in chart.  Rx sent as requested. Nothing further needed at this time.

## 2021-09-01 ENCOUNTER — Ambulatory Visit: Payer: Medicaid Other | Admitting: Family

## 2022-02-23 ENCOUNTER — Ambulatory Visit: Payer: Medicaid Other | Admitting: Family Medicine

## 2022-03-27 ENCOUNTER — Other Ambulatory Visit: Payer: Self-pay

## 2022-03-27 ENCOUNTER — Encounter: Payer: Self-pay | Admitting: *Deleted

## 2022-03-27 ENCOUNTER — Ambulatory Visit
Admission: EM | Admit: 2022-03-27 | Discharge: 2022-03-27 | Disposition: A | Discharge status: Home / Self Care | Payer: 59 | Attending: Urgent Care | Admitting: Urgent Care

## 2022-03-27 DIAGNOSIS — Z1152 Encounter for screening for COVID-19: Secondary | ICD-10-CM | POA: Diagnosis not present

## 2022-03-27 DIAGNOSIS — Z20828 Contact with and (suspected) exposure to other viral communicable diseases: Secondary | ICD-10-CM | POA: Insufficient documentation

## 2022-03-27 DIAGNOSIS — B349 Viral infection, unspecified: Secondary | ICD-10-CM | POA: Diagnosis present

## 2022-03-27 DIAGNOSIS — R070 Pain in throat: Secondary | ICD-10-CM | POA: Diagnosis not present

## 2022-03-27 LAB — RESP PANEL BY RT-PCR (FLU A&B, COVID) ARPGX2
Influenza A by PCR: NEGATIVE
Influenza B by PCR: NEGATIVE
SARS Coronavirus 2 by RT PCR: NEGATIVE

## 2022-03-27 LAB — POCT RAPID STREP A (OFFICE): Rapid Strep A Screen: NEGATIVE

## 2022-03-27 MED ORDER — ACETAMINOPHEN 325 MG PO TABS: 650.0000 mg | ORAL_TABLET | Freq: Four times a day (QID) | ORAL | 0 refills | Status: DC | PRN

## 2022-03-27 MED ORDER — IBUPROFEN 600 MG PO TABS: 600.0000 mg | ORAL_TABLET | Freq: Four times a day (QID) | ORAL | 0 refills | Status: DC | PRN

## 2022-03-27 MED ORDER — PSEUDOEPHEDRINE HCL 60 MG PO TABS: 60.0000 mg | ORAL_TABLET | Freq: Three times a day (TID) | ORAL | 0 refills | Status: DC | PRN

## 2022-03-27 MED ORDER — CETIRIZINE HCL 10 MG PO TABS: 10.0000 mg | ORAL_TABLET | Freq: Every day | ORAL | 0 refills | Status: AC

## 2022-03-27 MED ORDER — PROMETHAZINE-DM 6.25-15 MG/5ML PO SYRP: 2.5000 mL | ORAL_SOLUTION | Freq: Three times a day (TID) | ORAL | 0 refills | Status: DC | PRN

## 2022-03-27 MED ORDER — OSELTAMIVIR PHOSPHATE 75 MG PO CAPS: 75.0000 mg | ORAL_CAPSULE | Freq: Two times a day (BID) | ORAL | 0 refills | Status: DC

## 2022-03-27 NOTE — Discharge Instructions (Addendum)
We will notify you of your test results as they arrive and may take between about 24 hours.  I encourage you to sign up for MyChart if you have not already done so as this can be the easiest way for [place] to communicate results to you online or through a phone app.  Generally, we only contact you if it is a positive test result.  In the meantime, if you develop worsening symptoms including fever, chest pain, shortness of breath despite our current treatment plan then please report to the emergency room as this may be a sign of worsening status from possible viral infection.  Start taking Tamiflu given that your son has influenza A. For sore throat or cough try using a honey-based tea. Use 3 teaspoons of honey with juice squeezed from half lemon. Place shaved pieces of ginger into 1/2-1 cup of water and warm over stove top. Then mix the ingredients and repeat every 4 hours as needed. Please take Tylenol 500mg -650mg  every 6 hours for aches and pains, fevers. Hydrate very well with at least 2 liters of water. Eat light meals such as soups to replenish electrolytes and soft fruits, veggies. Start an antihistamine like Zyrtec for postnasal drainage, sinus congestion.  You can take this together with pseudoephedrine (Sudafed) at a dose of 60 mg 2-3 times a day as needed for the same kind of congestion.  Use the cough medications as needed.

## 2022-03-27 NOTE — ED Notes (Addendum)
C/O sore throat and fever up to 102 onset yesterday. Denies runny nose, cough, or congestion. States son currently sick with influenza A (diagnosed yesterday) and had RSV 2 wks ago, but does not have same sxs. Has been taking Theraflu.

## 2022-03-27 NOTE — ED Provider Notes (Signed)
[facility]  Note:  This document was prepared using Conservation officer, historic buildings and may include unintentional dictation errors.  MRN: [id number] DOB: 12-08-1985  Subjective:   Breanna English is a 36 y.o. female presenting for 1 day history of acute onset body pains, throat pain, painful swallowing, subjective fever.  Patient had exposure to influenza through her son who tested positive for influenza A.  No chest pain, shortness of breath or wheezing, coughing.  No smoking.  Patient is not opposed to COVID and flu test.  She would also like to make sure about strep.     Allergies  Allergen Reactions   Penicillins Anaphylaxis    seizure    Past Medical History:  Diagnosis Date   Ovarian cyst      Past Surgical History:  Procedure Laterality Date   CHOLECYSTECTOMY N/A 06/29/2017   Procedure: LAPAROSCOPIC CHOLECYSTECTOMY;  Surgeon: Kinsinger, De Blanch, MD;  Location: [facility] OR;  Service: General;  Laterality: N/A;   LAPAROSCOPIC CHOLECYSTECTOMY  06/29/2017   OVARIAN CYST REMOVAL  2002   TUBAL LIGATION      History reviewed. No pertinent family history.  Social History   Tobacco Use   Smoking status: Former    Types: Cigarettes    Quit date: 06/01/2015    Years since quitting: 6.8   Smokeless tobacco: Never  Vaping Use   Vaping Use: Never used  Substance Use Topics   Alcohol use: Not Currently   Drug use: No    ROS   Objective:   Vitals: BP 128/89 (BP Location: Right Arm)   Pulse 82   Temp 98.6 F (37 C) (Oral)   Resp 16   LMP 03/20/2022 (Approximate)   SpO2 95%   Physical Exam Constitutional:      General: She is not in acute distress.    Appearance: Normal appearance. She is well-developed. She is not ill-appearing, toxic-appearing or diaphoretic.  HENT:     Head: Normocephalic and atraumatic.     Nose: Nose normal.     Mouth/Throat:     Mouth: Mucous membranes are moist.     Pharynx: No pharyngeal swelling,  oropharyngeal exudate, posterior oropharyngeal erythema or uvula swelling.     Tonsils: No tonsillar exudate or tonsillar abscesses. 0 on the right. 0 on the left.  Eyes:     General: No scleral icterus.       Right eye: No discharge.        Left eye: No discharge.     Extraocular Movements: Extraocular movements intact.  Cardiovascular:     Rate and Rhythm: Normal rate and regular rhythm.     Heart sounds: Normal heart sounds. No murmur heard.    No friction rub. No gallop.  Pulmonary:     Effort: Pulmonary effort is normal. No respiratory distress.     Breath sounds: No stridor. No wheezing, rhonchi or rales.  Chest:     Chest wall: No tenderness.  Skin:    General: Skin is warm and dry.  Neurological:     General: No focal deficit present.     Mental Status: She is alert and oriented to person, place, and time.  Psychiatric:        Mood and Affect: Mood normal.        Behavior: Behavior normal.     Results for orders placed or performed during the hospital encounter of 03/27/22 (from the past 24 hour(s))  POCT rapid strep A  Status: None   Collection Time: 03/27/22 12:06 PM  Result Value Ref Range   Rapid Strep A Screen Negative Negative    Assessment and Plan :   PDMP not reviewed this encounter.  1. Acute viral syndrome   2. Throat pain   3. Exposure to influenza     Recommended empiric treatment for influenza given her exposure.  Labs pending.  Throat culture pending.  Use supportive care otherwise. Counseled patient on potential for adverse effects with medications prescribed/recommended today, ER and return-to-clinic precautions discussed, patient verbalized understanding.    Jaynee Eagles, PA-C 03/27/22 1241

## 2022-03-28 LAB — CULTURE, GROUP A STREP (THRC)

## 2022-03-29 LAB — CULTURE, GROUP A STREP (THRC)

## 2022-03-30 ENCOUNTER — Ambulatory Visit
Admission: EM | Admit: 2022-03-30 | Discharge: 2022-03-30 | Disposition: A | Discharge status: Home / Self Care | Payer: 59 | Attending: Emergency Medicine | Admitting: Emergency Medicine

## 2022-03-30 DIAGNOSIS — R058 Other specified cough: Secondary | ICD-10-CM | POA: Diagnosis present

## 2022-03-30 DIAGNOSIS — B349 Viral infection, unspecified: Secondary | ICD-10-CM | POA: Insufficient documentation

## 2022-03-30 DIAGNOSIS — Z1152 Encounter for screening for COVID-19: Secondary | ICD-10-CM | POA: Diagnosis not present

## 2022-03-30 DIAGNOSIS — H66002 Acute suppurative otitis media without spontaneous rupture of ear drum, left ear: Secondary | ICD-10-CM | POA: Insufficient documentation

## 2022-03-30 MED ORDER — CEFDINIR 300 MG PO CAPS: 300.0000 mg | ORAL_CAPSULE | Freq: Two times a day (BID) | ORAL | 0 refills | Status: AC

## 2022-03-30 MED ORDER — GUAIFENESIN 400 MG PO TABS: ORAL_TABLET | ORAL | 0 refills | Status: DC

## 2022-03-30 MED ORDER — IPRATROPIUM BROMIDE 0.06 % NA SOLN: 2.0000 | Freq: Three times a day (TID) | NASAL | 1 refills | Status: DC

## 2022-03-30 NOTE — Discharge Instructions (Addendum)
You received a second COVID-19 PCR test today.  The result of your COVID-19 test will be posted to your MyChart once it is complete, typically this takes 24-36 hours.     If your COVID-19 PCR test is positive, you will be contacted by phone.  Due to the duration of your symptoms, antiviral treatment is no longer indicated.     If your second COVID-19 PCR test is negative, then you can be confident that your illness is likely due to one of the many less serious illnesses that are circulating in our community right now.  Conservative care is recommended with rest, drinking plenty of clear fluids, eating only when hungry, eating supportive medications for your symptoms and avoiding being around other people.  Please remain at home until you are fever free for 24 hours without the use of antifever medications such as Tylenol and ibuprofen.    Please read below to learn more about the medications, dosages and frequencies that I recommend to help alleviate your symptoms and to get you feeling better soon:  Omnicef (cefdinir): For treatment of bacterial left inner ear infection please 1 capsule twice daily for 10 days, you can take it with or without food.  Because your penicillin allergy was more than 30 years ago, because your reaction was not anaphylactic and because there is a less than 5% chance of cross-reactivity between cefdinir and penicillin, cefdinir is a safe option for you and a much more effective treatment than any of the alternatives recommended for truly penicillin allergic patients.  Please also consider speaking with your primary care provider about being referred to an allergist for penicillin D testing and desensitization if indicated.  Treating your penicillin allergy now may save your life from a dangerous infection someday in the future.  Zyrtec (cetirizine): This is an excellent second-generation antihistamine that helps to reduce respiratory inflammatory response to environmental  allergens.  Please continue this medication   Atrovent (ipratropium): This is an excellent nasal decongestant spray that does not cause rebound congestion.  I have added this to your regimen because I believe it will help you with some of your upper respiratory congestion.  Please instill 2 sprays into each nare with each use.  Please use the spray up to 3 times daily as needed.  I have provided you with a prescription for this medication.      Advil, Motrin (ibuprofen): This is a good anti-inflammatory medication which not only addresses aches, pains but also significantly reduces soft tissue inflammation of the upper airways that causes sinus and nasal congestion as well as inflammation of the lower airways which makes you feel like your breathing is constricted or your cough feel tight.  I recommend that you take 400 mg every 8 hours as needed.      Robitussin, Mucinex (guaifenesin): This is an expectorant.  This helps break up chest congestion and loosen up thick nasal drainage making phlegm and drainage more liquid and therefore easier to remove.  I recommend being 400 mg three times daily as needed.  Welcome to continue using Promethazine DM at nighttime.   Please follow-up within the next 5-7 days either with your primary care provider or urgent care if your symptoms do not resolve.  If you do not have a primary care provider, we will assist you in finding one.        Thank you for visiting urgent care today.  We appreciate the opportunity to participate in your care.

## 2022-03-30 NOTE — ED Triage Notes (Signed)
Pt c/o productive (green phlegm) cough, fever, and generalized body aches.   The patient states her son recently tested positive for the flu but her test was negative.

## 2022-03-30 NOTE — ED Provider Notes (Signed)
[facility]    CSN: [id number] Arrival date & time: 03/30/22  0801    HISTORY   Chief Complaint  Patient presents with   Generalized Body Aches   Cough   HPI Breanna English is a pleasant, 36 y.o. female who presents to urgent care today. Patient was seen at this location 3 days ago for chief complaint of acute onset body pains, throat pain, painful swallowing, subjective fever.  Her vital signs were normal during her visit 3 days ago as they are today, patient's oxygen saturation has improved from previous.  Testing for streptococcal pharyngitis, streptococcal throat culture, influenza and COVID-19 tests were all negative 3 days ago.  Today, patient is complaining of cough productive of green phlegm, continued subjective fever and continued generalized body aches.  She does not feel significantly better than she did 3 days ago and as that her cough is worse  The history is provided by the patient.  Cough Cough characteristics:  Productive Sputum characteristics:  Chilton Si  Past Medical History:  Diagnosis Date   Ovarian cyst    Patient Active Problem List   Diagnosis Date Noted   Acute cholecystitis due to biliary calculus 06/29/2017   Past Surgical History:  Procedure Laterality Date   CHOLECYSTECTOMY N/A 06/29/2017   Procedure: LAPAROSCOPIC CHOLECYSTECTOMY;  Surgeon: Kinsinger, De Blanch, MD;  Location: [facility] OR;  Service: General;  Laterality: N/A;   LAPAROSCOPIC CHOLECYSTECTOMY  06/29/2017   OVARIAN CYST REMOVAL  2002   TUBAL LIGATION     OB History     Gravida  5   Para  3   Term      Preterm      AB      Living         SAB      IAB      Ectopic      Multiple      Live Births             Home Medications    Prior to Admission medications   Medication Sig Start Date End Date Taking? Authorizing Provider  Liraglutide -Weight Management (SAXENDA) 18 MG/3ML SOPN Inject 1.2 mg into the skin daily.   Yes [provider]   acetaminophen (TYLENOL) 325 MG tablet Take 2 tablets (650 mg total) by mouth every 6 (six) hours as needed for moderate pain. 03/27/22   Wallis Bamberg, PA-C  cetirizine (ZYRTEC ALLERGY) 10 MG tablet Take 1 tablet (10 mg total) by mouth daily. 03/27/22   Wallis Bamberg, PA-C  ibuprofen (ADVIL) 600 MG tablet Take 1 tablet (600 mg total) by mouth every 6 (six) hours as needed. 03/27/22   Wallis Bamberg, PA-C  promethazine-dextromethorphan (PROMETHAZINE-DM) 6.25-15 MG/5ML syrup Take 2.5 mLs by mouth 3 (three) times daily as needed for cough. 03/27/22   Wallis Bamberg, PA-C  pseudoephedrine (SUDAFED) 60 MG tablet Take 1 tablet (60 mg total) by mouth every 8 (eight) hours as needed for congestion. 03/27/22   Wallis Bamberg, PA-C    Family History History reviewed. No pertinent family history. Social History Social History   Tobacco Use   Smoking status: Former    Types: Cigarettes    Quit date: 06/01/2015    Years since quitting: 6.8   Smokeless tobacco: Never  Vaping Use   Vaping Use: Never used  Substance Use Topics   Alcohol use: Not Currently   Drug use: No   Allergies   Penicillins  Review of Systems Review of Systems  Respiratory:  Positive for cough.    Pertinent findings revealed after performing a 14 point review of systems has been noted in the history of present illness.  Physical Exam Triage Vital Signs ED Triage Vitals  Enc Vitals Group     BP 03/27/21 0827 (!) 147/82     Pulse Rate 03/27/21 0827 72     Resp 03/27/21 0827 18     Temp 03/27/21 0827 98.3 F (36.8 C)     Temp Source 03/27/21 0827 Oral     SpO2 03/27/21 0827 98 %     Weight --      Height --      Head Circumference --      Peak Flow --      Pain Score 03/27/21 0826 5     Pain Loc --      Pain Edu? --      Excl. in GC? --   No data found.  Updated Vital Signs BP 121/87 (BP Location: Right Arm)   Pulse 80   Temp 98.2 F (36.8 C) (Oral)   Resp 16   LMP 03/20/2022 (Approximate)   SpO2 98%   Physical  Exam Vitals and nursing note reviewed.  Constitutional:      General: She is not in acute distress.    Appearance: Normal appearance. She is not ill-appearing.  HENT:     Head: Normocephalic and atraumatic.     Salivary Glands: Right salivary gland is diffusely enlarged. Right salivary gland is not tender. Left salivary gland is diffusely enlarged. Left salivary gland is not tender.     Right Ear: Hearing, tympanic membrane, ear canal and external ear normal. No drainage. No middle ear effusion. There is no impacted cerumen. Tympanic membrane is not erythematous or bulging.     Left Ear: Hearing, ear canal and external ear normal. No drainage. A middle ear effusion is present. There is no impacted cerumen. Tympanic membrane is erythematous and retracted. Tympanic membrane is not bulging.     Nose: Mucosal edema, congestion and rhinorrhea present. No nasal deformity or septal deviation. Rhinorrhea is clear.     Right Turbinates: Enlarged. Not swollen or pale.     Left Turbinates: Enlarged. Not swollen or pale.     Right Sinus: No maxillary sinus tenderness or frontal sinus tenderness.     Left Sinus: No maxillary sinus tenderness or frontal sinus tenderness.     Mouth/Throat:     Lips: Pink. No lesions.     Mouth: Mucous membranes are moist. No oral lesions.     Pharynx: Oropharynx is clear. Uvula midline. Posterior oropharyngeal erythema present. No pharyngeal swelling, oropharyngeal exudate or uvula swelling.     Tonsils: No tonsillar exudate. 0 on the right. 0 on the left.  Eyes:     General: Lids are normal. Vision grossly intact.        Right eye: No discharge.        Left eye: No discharge.     Extraocular Movements: Extraocular movements intact.     Conjunctiva/sclera: Conjunctivae normal.     Right eye: Right conjunctiva is not injected. No chemosis or exudate.    Left eye: Left conjunctiva is not injected. No chemosis or exudate.    Pupils: Pupils are equal, round, and reactive to  light.  Neck:     Thyroid: No thyroid mass, thyromegaly or thyroid tenderness.     Trachea: Trachea and phonation normal.  Cardiovascular:     Rate and Rhythm: Normal  rate and regular rhythm.     Pulses: Normal pulses.     Heart sounds: Normal heart sounds, S1 normal and S2 normal. No murmur heard.    No friction rub. No gallop. No S3 or S4 sounds.  Pulmonary:     Effort: Pulmonary effort is normal. No tachypnea, bradypnea, accessory muscle usage, prolonged expiration, respiratory distress or retractions.     Breath sounds: No stridor, decreased air movement or transmitted upper airway sounds. Examination of the right-upper field reveals decreased breath sounds. Examination of the left-upper field reveals decreased breath sounds. Examination of the right-middle field reveals decreased breath sounds. Examination of the left-middle field reveals decreased breath sounds. Examination of the right-lower field reveals decreased breath sounds. Examination of the left-lower field reveals decreased breath sounds. Decreased breath sounds present. No wheezing, rhonchi or rales.  Chest:     Chest wall: No tenderness.  Musculoskeletal:        General: Normal range of motion.     Cervical back: Full passive range of motion without pain, normal range of motion and neck supple. Normal range of motion.     Right lower leg: No edema.     Left lower leg: No edema.  Lymphadenopathy:     Cervical: Cervical adenopathy present.     Right cervical: Superficial cervical adenopathy and deep cervical adenopathy present.     Left cervical: Superficial cervical adenopathy and deep cervical adenopathy present.  Skin:    General: Skin is warm and dry.     Findings: No erythema or rash.  Neurological:     General: No focal deficit present.     Mental Status: She is alert and oriented to person, place, and time.  Psychiatric:        Mood and Affect: Mood normal.        Behavior: Behavior normal.     Visual  Acuity Right Eye Distance:   Left Eye Distance:   Bilateral Distance:    Right Eye Near:   Left Eye Near:    Bilateral Near:     [facility] Couse / Diagnostics / Procedures:     Radiology No results found.  Procedures Procedures (including critical care time) EKG  Pending results:  Labs Reviewed  SARS CORONAVIRUS 2 (TAT 6-24 HRS)    Medications Ordered in UC: Medications - No data to display  UC Diagnoses / Final Clinical Impressions(s)   I have reviewed the triage vital signs and the nursing notes.  Pertinent labs & imaging results that were available during my care of the patient were reviewed by me and considered in my medical decision making (see chart for details).    Final diagnoses:  Viral illness  Acute suppurative otitis media of left ear without spontaneous rupture of tympanic membrane, recurrence not specified  Cough productive of purulent sputum   Patient provided with a prescription for cefdinir for treatment of bacterial infection in her left inner ear.  Patient reports a history of seizure as a small child when given penicillin.  Patient states this was over 30 years ago, denies phylactic reaction.  Patient advised that there is a less than 5% risk of cross-reactivity between cefdinir and penicillin, patient verbalized understanding was agreeable to taking cefdinir.  Patient provided with prescriptions for guaifenesin to help thin mucus secretions and ease work of coughing, ipratropium nasal spray to dry up nasal secretions and prevent postnasal drip.  Return precautions advised.  ED Prescriptions     Medication Sig Dispense Auth.  Provider   cefdinir (OMNICEF) 300 MG capsule Take 1 capsule (300 mg total) by mouth 2 (two) times daily for 10 days. 20 capsule Lynden Oxford Scales, PA-C   ipratropium (ATROVENT) 0.06 % nasal spray Place 2 sprays into both nostrils 3 (three) times daily. As needed for nasal congestion, runny nose 15 mL Lynden Oxford Scales, PA-C    guaifenesin (HUMIBID E) 400 MG TABS tablet Take 1 tablet 3 times daily as needed for chest congestion and cough 21 tablet Lynden Oxford Scales, PA-C      PDMP not reviewed this encounter.  Disposition Upon Discharge:  Condition: stable for discharge home Home: take medications as prescribed; routine discharge instructions as discussed; follow up as advised.  Patient presented with an acute illness with associated systemic symptoms and significant discomfort requiring urgent management. In my opinion, this is a condition that a prudent lay person (someone who possesses an average knowledge of health and medicine) may potentially expect to result in complications if not addressed urgently such as respiratory distress, impairment of bodily function or dysfunction of bodily organs.   Routine symptom specific, illness specific and/or disease specific instructions were discussed with the patient and/or caregiver at length.   As such, the patient has been evaluated and assessed, work-up was performed and treatment was provided in alignment with urgent care protocols and evidence based medicine.  Patient/parent/caregiver has been advised that the patient may require follow up for further testing and treatment if the symptoms continue in spite of treatment, as clinically indicated and appropriate.  If the patient was tested for COVID-19, Influenza and/or RSV, then the patient/parent/guardian was advised to isolate at home pending the results of his/her diagnostic coronavirus test and potentially longer if they're positive. I have also advised pt that if his/her COVID-19 test returns positive, it's recommended to self-isolate for at least 10 days after symptoms first appeared AND until fever-free for 24 hours without fever reducer AND other symptoms have improved or resolved. Discussed self-isolation recommendations as well as instructions for household member/close contacts as per the [facility] and [place] [facility], and  also gave patient the [place] packet with this information.  Patient/parent/caregiver has been advised to return to the [facility] or PCP in 3-5 days if no better; to PCP or the Emergency Department if new signs and symptoms develop, or if the current signs or symptoms continue to change or worsen for further workup, evaluation and treatment as clinically indicated and appropriate  The patient will follow up with their current PCP if and as advised. If the patient does not currently have a PCP we will assist them in obtaining one.   The patient may need specialty follow up if the symptoms continue, in spite of conservative treatment and management, for further workup, evaluation, consultation and treatment as clinically indicated and appropriate.  Patient/parent/caregiver verbalized understanding and agreement of plan as discussed.  All questions were addressed during visit.  Please see discharge instructions below for further details of plan.  Discharge Instructions:   Discharge Instructions      You received a second COVID-19 PCR test today.  The result of your COVID-19 test will be posted to your MyChart once it is complete, typically this takes 24-36 hours.     If your COVID-19 PCR test is positive, you will be contacted by phone.  Due to the duration of your symptoms, antiviral treatment is no longer indicated.     If your second COVID-19 PCR test is negative, then you can be  confident that your illness is likely due to one of the many less serious illnesses that are circulating in our community right now.  Conservative care is recommended with rest, drinking plenty of clear fluids, eating only when hungry, eating supportive medications for your symptoms and avoiding being around other people.  Please remain at home until you are fever free for 24 hours without the use of antifever medications such as Tylenol and ibuprofen.    Please read below to learn more about the medications, dosages and  frequencies that I recommend to help alleviate your symptoms and to get you feeling better soon:  Omnicef (cefdinir): For treatment of bacterial left inner ear infection please 1 capsule twice daily for 10 days, you can take it with or without food.  Because your penicillin allergy was more than 30 years ago, because your reaction was not anaphylactic and because there is a less than 5% chance of cross-reactivity between cefdinir and penicillin, cefdinir is a safe option for you and a much more effective treatment than any of the alternatives recommended for truly penicillin allergic patients.  Please also consider speaking with your primary care provider about being referred to an allergist for penicillin D testing and desensitization if indicated.  Treating your penicillin allergy now may save your life from a dangerous infection someday in the future.  Zyrtec (cetirizine): This is an excellent second-generation antihistamine that helps to reduce respiratory inflammatory response to environmental allergens.  Please continue this medication   Atrovent (ipratropium): This is an excellent nasal decongestant spray that does not cause rebound congestion.  I have added this to your regimen because I believe it will help you with some of your upper respiratory congestion.  Please instill 2 sprays into each nare with each use.  Please use the spray up to 3 times daily as needed.  I have provided you with a prescription for this medication.      Advil, Motrin (ibuprofen): This is a good anti-inflammatory medication which not only addresses aches, pains but also significantly reduces soft tissue inflammation of the upper airways that causes sinus and nasal congestion as well as inflammation of the lower airways which makes you feel like your breathing is constricted or your cough feel tight.  I recommend that you take 400 mg every 8 hours as needed.      Robitussin, Mucinex (guaifenesin): This is an expectorant.   This helps break up chest congestion and loosen up thick nasal drainage making phlegm and drainage more liquid and therefore easier to remove.  I recommend being 400 mg three times daily as needed.  Welcome to continue using Promethazine DM at nighttime.   Please follow-up within the next 5-7 days either with your primary care provider or urgent care if your symptoms do not resolve.  If you do not have a primary care provider, we will assist you in finding one.        Thank you for visiting urgent care today.  We appreciate the opportunity to participate in your care.         This office note has been dictated using Teaching laboratory technicianDragon speech recognition software.  Unfortunately, this method of dictation can sometimes lead to typographical or grammatical errors.  I apologize for your inconvenience in advance if this occurs.  Please do not hesitate to reach out to me if clarification is needed.      Theadora RamaMorgan, Laderius Valbuena Scales, [place]PA-C 03/30/22 1933

## 2022-03-31 LAB — SARS CORONAVIRUS 2 (TAT 6-24 HRS): SARS Coronavirus 2: NEGATIVE

## 2022-04-02 ENCOUNTER — Telehealth: Payer: Self-pay | Admitting: Emergency Medicine

## 2022-04-02 MED ORDER — IBUPROFEN 600 MG PO TABS: 600.0000 mg | ORAL_TABLET | Freq: Three times a day (TID) | ORAL | 0 refills | Status: DC | PRN

## 2022-04-02 NOTE — Telephone Encounter (Signed)
Patient verification complete (name and date of birth).   Relayed message to provider about patient requesting pain medication for ear ache.   Patient made aware of ibuprofen 600 mg prescription that was sent to pharmacy.

## 2022-04-02 NOTE — Telephone Encounter (Signed)
Patient called requesting medication for pain.  Patient was provided with ibuprofen 600 mg every 4 hours as needed on October 28.  Patient provided with a renewal of same today.

## 2022-07-15 ENCOUNTER — Ambulatory Visit
Admission: EM | Admit: 2022-07-15 | Discharge: 2022-07-15 | Disposition: A | Discharge status: Home / Self Care | Payer: Managed Care, Other (non HMO) | Attending: Nurse Practitioner | Admitting: Nurse Practitioner

## 2022-07-15 ENCOUNTER — Ambulatory Visit: Admit: 2022-07-15 | Payer: No Typology Code available for payment source

## 2022-07-15 DIAGNOSIS — B349 Viral infection, unspecified: Secondary | ICD-10-CM

## 2022-07-15 DIAGNOSIS — Z20828 Contact with and (suspected) exposure to other viral communicable diseases: Secondary | ICD-10-CM

## 2022-07-15 LAB — POCT INFLUENZA A/B
Influenza A, POC: NEGATIVE
Influenza B, POC: NEGATIVE

## 2022-07-15 MED ORDER — OSELTAMIVIR PHOSPHATE 75 MG PO CAPS: 75.0000 mg | ORAL_CAPSULE | Freq: Every day | ORAL | 0 refills | Status: AC

## 2022-07-15 NOTE — Discharge Instructions (Signed)
Start Tamiflu once daily for 7 days Continue over-the-counter cough medicine and ibuprofen or Tylenol as needed Rest and fluids Follow-up with your PCP 2 to 3 days for recheck Please go the emergency room if you have any worsening symptoms

## 2022-07-15 NOTE — ED Triage Notes (Signed)
Pt states that she had COVID x1 week ago.  Pt states that she has now been exposed to the FLU.  Pt states that she has a fever, cough, and body aches. X1 day

## 2022-07-15 NOTE — ED Provider Notes (Signed)
[facility]    CSN: [id number] Arrival date & time: 07/15/22  1240      History   Chief Complaint Chief Complaint  Patient presents with   Fever    Fever, cough, and body aches. X1 day    HPI Breanna English is a 37 y.o. female  presents for evaluation of URI symptoms for 1 days. Patient reports associated symptoms of  subjective fevers, cough, body aches. Denies N/V/D, ST, ear pain, documented fevers, or SOB. Patient does not have a hx of asthma. Is a previous smoker.  Patient reports she had COVID last week and her symptoms were improving but then both of her children were diagnosed with flu and yesterday her symptoms worsened again.  Pt has taken Tylenol ibuprofen OTC for symptoms. Pt has no other concerns at this time.    Fever Associated symptoms: congestion, cough and myalgias     Past Medical History:  Diagnosis Date   Ovarian cyst     Patient Active Problem List   Diagnosis Date Noted   Acute cholecystitis due to biliary calculus 06/29/2017    Past Surgical History:  Procedure Laterality Date   CHOLECYSTECTOMY N/A 06/29/2017   Procedure: LAPAROSCOPIC CHOLECYSTECTOMY;  Surgeon: Kinsinger, Arta Bruce, MD;  Location: [place];  Service: General;  Laterality: N/A;   LAPAROSCOPIC CHOLECYSTECTOMY  06/29/2017   OVARIAN CYST REMOVAL  2002   TUBAL LIGATION      OB History     Gravida  5   Para  3   Term      Preterm      AB      Living         SAB      IAB      Ectopic      Multiple      Live Births               Home Medications    Prior to Admission medications   Medication Sig Start Date End Date Taking? Authorizing Provider  oseltamivir (TAMIFLU) 75 MG capsule Take 1 capsule (75 mg total) by mouth daily for 7 days. 07/15/22 07/22/22 Yes Melynda Ripple, NP  acetaminophen (TYLENOL) 325 MG tablet Take 2 tablets (650 mg total) by mouth every 6 (six) hours as needed for moderate pain. 03/27/22   Jaynee Eagles, PA-C  cetirizine (ZYRTEC  ALLERGY) 10 MG tablet Take 1 tablet (10 mg total) by mouth daily. 03/27/22   Jaynee Eagles, PA-C  guaifenesin (HUMIBID E) 400 MG TABS tablet Take 1 tablet 3 times daily as needed for chest congestion and cough 03/30/22   Lynden Oxford Scales, PA-C  ibuprofen (ADVIL) 600 MG tablet Take 1 tablet (600 mg total) by mouth every 8 (eight) hours as needed for up to 30 doses for fever, headache, mild pain or moderate pain (Inflammation). Take 1 tablet 3 times daily as needed for inflammation of upper airways and/or pain. 04/02/22   Lynden Oxford Scales, PA-C  ipratropium (ATROVENT) 0.06 % nasal spray Place 2 sprays into both nostrils 3 (three) times daily. As needed for nasal congestion, runny nose 03/30/22   Lynden Oxford Scales, PA-C  Liraglutide -Weight Management (SAXENDA) 18 MG/3ML SOPN Inject 1.2 mg into the skin daily.    [provider]  promethazine-dextromethorphan (PROMETHAZINE-DM) 6.25-15 MG/5ML syrup Take 2.5 mLs by mouth 3 (three) times daily as needed for cough. 03/27/22   Jaynee Eagles, PA-C  ibuprofen (ADVIL) 600 MG tablet Take 1 tablet (600 mg total)  by mouth every 6 (six) hours as needed. 03/27/22   Jaynee Eagles, PA-C    Family History History reviewed. No pertinent family history.  Social History Social History   Tobacco Use   Smoking status: Former    Types: Cigarettes    Quit date: 06/01/2015    Years since quitting: 7.1   Smokeless tobacco: Never  Vaping Use   Vaping Use: Never used  Substance Use Topics   Alcohol use: Not Currently   Drug use: No     Allergies   Penicillins   Review of Systems Review of Systems  Constitutional:  Positive for fever.  HENT:  Positive for congestion.   Respiratory:  Positive for cough.   Musculoskeletal:  Positive for myalgias.     Physical Exam Triage Vital Signs ED Triage Vitals  Enc Vitals Group     BP 07/15/22 1304 135/87     Pulse Rate 07/15/22 1304 75     Resp 07/15/22 1304 18     Temp 07/15/22 1304 98.7 F  (37.1 C)     Temp src --      SpO2 07/15/22 1304 98 %     Weight 07/15/22 1302 257 lb (116.6 kg)     Height 07/15/22 1302 5' 3"$  (1.6 m)     Head Circumference --      Peak Flow --      Pain Score 07/15/22 1302 8     Pain Loc --      Pain Edu? --      Excl. in [place]? --    No data found.  Updated Vital Signs BP 135/87 (BP Location: Left Arm)   Pulse 75   Temp 98.7 F (37.1 C)   Resp 18   Ht 5' 3"$  (1.6 m)   Wt 257 lb (116.6 kg)   LMP 07/02/2022   SpO2 98%   BMI 45.53 kg/m   Visual Acuity Right Eye Distance:   Left Eye Distance:   Bilateral Distance:    Right Eye Near:   Left Eye Near:    Bilateral Near:     Physical Exam Vitals and nursing note reviewed.  Constitutional:      General: She is not in acute distress.    Appearance: She is well-developed. She is not ill-appearing.  HENT:     Head: Normocephalic and atraumatic.     Right Ear: Tympanic membrane and ear canal normal.     Left Ear: Tympanic membrane and ear canal normal.     Nose: Congestion present.     Mouth/Throat:     Mouth: Mucous membranes are moist.     Pharynx: Oropharynx is clear. Uvula midline. No oropharyngeal exudate or posterior oropharyngeal erythema.     Tonsils: No tonsillar exudate or tonsillar abscesses.  Eyes:     Conjunctiva/sclera: Conjunctivae normal.     Pupils: Pupils are equal, round, and reactive to light.  Cardiovascular:     Rate and Rhythm: Normal rate and regular rhythm.     Heart sounds: Normal heart sounds.  Pulmonary:     Effort: Pulmonary effort is normal.     Breath sounds: Normal breath sounds.  Musculoskeletal:     Cervical back: Normal range of motion and neck supple.  Lymphadenopathy:     Cervical: No cervical adenopathy.  Skin:    General: Skin is warm and dry.  Neurological:     General: No focal deficit present.     Mental Status: She is alert and  oriented to person, place, and time.  Psychiatric:        Mood and Affect: Mood normal.        Behavior:  Behavior normal.      UC Treatments / Results  Labs (all labs ordered are listed, but only abnormal results are displayed) Labs Reviewed  POCT INFLUENZA A/B    EKG   Radiology No results found.  Procedures Procedures (including critical care time)  Medications Ordered in UC Medications - No data to display  Initial Impression / Assessment and Plan / UC Course  I have reviewed the triage vital signs and the nursing notes.  Pertinent labs & imaging results that were available during my care of the patient were reviewed by me and considered in my medical decision making (see chart for details).     Reviewed exam and symptoms with patient.  No red flags on exam Negative rapid flu in clinic. Will start Tamiflu prophylactically given close exposure Continue over-the-counter cough medicine and/or Tylenol/ibuprofen as needed Final Clinical Impressions(s) / UC Diagnoses   Final diagnoses:  Exposure to the flu  Viral illness     Discharge Instructions      Start Tamiflu once daily for 7 days Continue over-the-counter cough medicine and ibuprofen or Tylenol as needed Rest and fluids Follow-up with your PCP 2 to 3 days for recheck Please go the emergency room if you have any worsening symptoms     ED Prescriptions     Medication Sig Dispense Auth. Provider   oseltamivir (TAMIFLU) 75 MG capsule Take 1 capsule (75 mg total) by mouth daily for 7 days. 7 capsule Melynda Ripple, NP      PDMP not reviewed this encounter.   Melynda Ripple, NP 07/15/22 1323

## 2022-09-07 NOTE — Progress Notes (Deleted)
   New Patient Visit  There were no vitals taken for this visit.   Subjective:    Patient ID: Breanna English, female    DOB: [birth year], 37 y.o.   MRN: [id number]  CC: No chief complaint on file.   HPI: Breanna English is a 37 y.o. female presents for new patient visit to establish care.  Introduced to Publishing rights manager role and practice setting.  All questions answered.  Discussed provider/patient relationship and expectations.   Past Medical History:  Diagnosis Date   Ovarian cyst     Past Surgical History:  Procedure Laterality Date   CHOLECYSTECTOMY N/A 06/29/2017   Procedure: LAPAROSCOPIC CHOLECYSTECTOMY;  Surgeon: Kinsinger, De Blanch, MD;  Location: [facility];  Service: General;  Laterality: N/A;   LAPAROSCOPIC CHOLECYSTECTOMY  06/29/2017   OVARIAN CYST REMOVAL  2002   TUBAL LIGATION      No family history on file.   Social History   Tobacco Use   Smoking status: Former    Types: Cigarettes    Quit date: 06/01/2015    Years since quitting: 7.2   Smokeless tobacco: Never  Vaping Use   Vaping Use: Never used  Substance Use Topics   Alcohol use: Not Currently   Drug use: No    Current Outpatient Medications on File Prior to Visit  Medication Sig Dispense Refill   acetaminophen (TYLENOL) 325 MG tablet Take 2 tablets (650 mg total) by mouth every 6 (six) hours as needed for moderate pain. 30 tablet 0   cetirizine (ZYRTEC ALLERGY) 10 MG tablet Take 1 tablet (10 mg total) by mouth daily. 30 tablet 0   guaifenesin (HUMIBID E) 400 MG TABS tablet Take 1 tablet 3 times daily as needed for chest congestion and cough 21 tablet 0   ibuprofen (ADVIL) 600 MG tablet Take 1 tablet (600 mg total) by mouth every 8 (eight) hours as needed for up to 30 doses for fever, headache, mild pain or moderate pain (Inflammation). Take 1 tablet 3 times daily as needed for inflammation of upper airways and/or pain. 30 tablet 0   ipratropium (ATROVENT) 0.06 % nasal spray Place 2 sprays into both  nostrils 3 (three) times daily. As needed for nasal congestion, runny nose 15 mL 1   Liraglutide -Weight Management (SAXENDA) 18 MG/3ML SOPN Inject 1.2 mg into the skin daily.     promethazine-dextromethorphan (PROMETHAZINE-DM) 6.25-15 MG/5ML syrup Take 2.5 mLs by mouth 3 (three) times daily as needed for cough. 100 mL 0   [DISCONTINUED] ibuprofen (ADVIL) 600 MG tablet Take 1 tablet (600 mg total) by mouth every 6 (six) hours as needed. 30 tablet 0   No current facility-administered medications on file prior to visit.     Review of Systems      Objective:    There were no vitals taken for this visit.  Wt Readings from Last 3 Encounters:  07/15/22 257 lb (116.6 kg)  01/29/19 261 lb (118.4 kg)  02/05/18 258 lb (117 kg)    BP Readings from Last 3 Encounters:  07/15/22 135/87  03/30/22 121/87  03/27/22 128/89    Physical Exam     Assessment & Plan:   Problem List Items Addressed This Visit   None    Follow up plan: No follow-ups on file.

## 2022-09-08 ENCOUNTER — Ambulatory Visit: Payer: Managed Care, Other (non HMO) | Admitting: Nurse Practitioner

## 2022-09-09 ENCOUNTER — Telehealth: Payer: Self-pay | Admitting: Nurse Practitioner

## 2022-09-09 NOTE — Telephone Encounter (Signed)
4.10.24 New pt no show /pt blocked for reschedule

## 2022-09-09 NOTE — Telephone Encounter (Signed)
No show for new patient visit, unable to reschedule at [facility]

## 2022-11-23 ENCOUNTER — Ambulatory Visit: Payer: Managed Care, Other (non HMO) | Admitting: Family Medicine

## 2022-11-23 ENCOUNTER — Encounter (HOSPITAL_BASED_OUTPATIENT_CLINIC_OR_DEPARTMENT_OTHER): Payer: Self-pay | Admitting: Emergency Medicine

## 2022-11-23 ENCOUNTER — Other Ambulatory Visit: Payer: Self-pay

## 2022-11-23 ENCOUNTER — Emergency Department (HOSPITAL_BASED_OUTPATIENT_CLINIC_OR_DEPARTMENT_OTHER): Payer: Managed Care, Other (non HMO)

## 2022-11-23 ENCOUNTER — Emergency Department (HOSPITAL_BASED_OUTPATIENT_CLINIC_OR_DEPARTMENT_OTHER)
Admission: EM | Admit: 2022-11-23 | Discharge: 2022-11-23 | Disposition: A | Discharge status: Home / Self Care | Payer: Managed Care, Other (non HMO) | Attending: Emergency Medicine | Admitting: Emergency Medicine

## 2022-11-23 DIAGNOSIS — R102 Pelvic and perineal pain: Secondary | ICD-10-CM | POA: Diagnosis present

## 2022-11-23 DIAGNOSIS — Z87891 Personal history of nicotine dependence: Secondary | ICD-10-CM | POA: Diagnosis not present

## 2022-11-23 LAB — CBC WITH DIFFERENTIAL/PLATELET
Abs Immature Granulocytes: 0.02 10*3/uL (ref 0.00–0.07)
Basophils Absolute: 0.1 10*3/uL (ref 0.0–0.1)
Basophils Relative: 1 %
Eosinophils Absolute: 0.2 10*3/uL (ref 0.0–0.5)
Eosinophils Relative: 3 %
HCT: 30.7 % — ABNORMAL LOW (ref 36.0–46.0)
Hemoglobin: 10 g/dL — ABNORMAL LOW (ref 12.0–15.0)
Immature Granulocytes: 0 %
Lymphocytes Relative: 30 %
Lymphs Abs: 2.6 10*3/uL (ref 0.7–4.0)
MCH: 28.1 pg (ref 26.0–34.0)
MCHC: 32.6 g/dL (ref 30.0–36.0)
MCV: 86.2 fL (ref 80.0–100.0)
Monocytes Absolute: 0.9 10*3/uL (ref 0.1–1.0)
Monocytes Relative: 11 %
Neutro Abs: 4.8 10*3/uL (ref 1.7–7.7)
Neutrophils Relative %: 55 %
Platelets: 283 10*3/uL (ref 150–400)
RBC: 3.56 MIL/uL — ABNORMAL LOW (ref 3.87–5.11)
RDW: 14.9 % (ref 11.5–15.5)
WBC: 8.5 10*3/uL (ref 4.0–10.5)
nRBC: 0 % (ref 0.0–0.2)

## 2022-11-23 LAB — BASIC METABOLIC PANEL
Anion gap: 8 (ref 5–15)
BUN: 14 mg/dL (ref 6–20)
CO2: 24 mmol/L (ref 22–32)
Calcium: 8.3 mg/dL — ABNORMAL LOW (ref 8.9–10.3)
Chloride: 104 mmol/L (ref 98–111)
Creatinine, Ser: 0.77 mg/dL (ref 0.44–1.00)
GFR, Estimated: >60 mL/min (ref >60)
Glucose, Bld: 99 mg/dL (ref 70–99)
Potassium: 3.8 mmol/L (ref 3.5–5.1)
Sodium: 136 mmol/L (ref 135–145)

## 2022-11-23 LAB — PREGNANCY, URINE: Preg Test, Ur: NEGATIVE

## 2022-11-23 LAB — URINALYSIS, ROUTINE W REFLEX MICROSCOPIC
Bilirubin Urine: NEGATIVE
Glucose, UA: NEGATIVE mg/dL
Hgb urine dipstick: NEGATIVE
Ketones, ur: NEGATIVE mg/dL
Leukocytes,Ua: NEGATIVE
Nitrite: NEGATIVE
Protein, ur: NEGATIVE mg/dL
Specific Gravity, Urine: 1.025 (ref 1.005–1.030)
pH: 5.5 (ref 5.0–8.0)

## 2022-11-23 MED ORDER — FENTANYL CITRATE PF 50 MCG/ML IJ SOSY
50.0000 ug | PREFILLED_SYRINGE | Freq: Once | INTRAMUSCULAR | Status: AC
  Administered 2022-11-23: 50 ug via INTRAVENOUS
  Filled 2022-11-23: qty 1

## 2022-11-23 MED ORDER — KETOROLAC TROMETHAMINE 15 MG/ML IJ SOLN
15.0000 mg | Freq: Once | INTRAMUSCULAR | Status: AC
  Administered 2022-11-23: 15 mg via INTRAVENOUS
  Filled 2022-11-23: qty 1

## 2022-11-23 MED ORDER — IOHEXOL 300 MG/ML  SOLN
125.0000 mL | Freq: Once | INTRAMUSCULAR | Status: AC | PRN
  Administered 2022-11-23: 125 mL via INTRAVENOUS

## 2022-11-23 MED ORDER — HYOSCYAMINE SULFATE 0.125 MG SL SUBL
0.1250 mg | SUBLINGUAL_TABLET | Freq: Once | SUBLINGUAL | Status: AC
  Administered 2022-11-23: 0.125 mg via SUBLINGUAL
  Filled 2022-11-23: qty 1

## 2022-11-23 MED ORDER — FENTANYL CITRATE PF 50 MCG/ML IJ SOSY
50.0000 ug | PREFILLED_SYRINGE | INTRAMUSCULAR | Status: DC | PRN
  Filled 2022-11-23: qty 1

## 2022-11-23 MED ORDER — HYOSCYAMINE SULFATE 0.125 MG SL SUBL: 0.1250 mg | SUBLINGUAL_TABLET | Freq: Four times a day (QID) | SUBLINGUAL | 0 refills | Status: DC | PRN

## 2022-11-23 NOTE — ED Triage Notes (Signed)
Pt state feel like she is labor, states has had tubes tied. Went to bed about 0000 and was awakened 30 mins ago with pain.

## 2022-11-23 NOTE — ED Provider Notes (Signed)
[facility] Provider Note: [name], MD, FACEP  CSN: [id number] MRN: [id number] ARRIVAL: 11/23/22 at 0155 ROOM: [facility]08/[facility]08   CHIEF COMPLAINT  Pelvic Pain   HISTORY OF PRESENT ILLNESS  11/23/22 2:29 AM Breanna English is a 37 y.o. female who urinated about an hour ago.  She states she voided more urine than usual.  She did not feel a particularly strong urge to urinate.  About 20 minutes after she urinated she began having pelvic spasms which she states feel just like labor pains (she has had her tubes tied).  She rates her spasms as a 10 out of 10 when they occur.   Past Medical History:  Diagnosis Date   Ovarian cyst     Past Surgical History:  Procedure Laterality Date   CHOLECYSTECTOMY N/A 06/29/2017   Procedure: LAPAROSCOPIC CHOLECYSTECTOMY;  Surgeon: Kinsinger, De Blanch, MD;  Location: [facility];  Service: General;  Laterality: N/A;   LAPAROSCOPIC CHOLECYSTECTOMY  06/29/2017   OVARIAN CYST REMOVAL  2002   TUBAL LIGATION      History reviewed. No pertinent family history.  Social History   Tobacco Use   Smoking status: Former    Types: Cigarettes    Quit date: 06/01/2015    Years since quitting: 7.4   Smokeless tobacco: Never  Vaping Use   Vaping Use: Never used  Substance Use Topics   Alcohol use: Not Currently   Drug use: No    Prior to Admission medications   Medication Sig Start Date End Date Taking? Authorizing Provider  acetaminophen (TYLENOL) 325 MG tablet Take 2 tablets (650 mg total) by mouth every 6 (six) hours as needed for moderate pain. 03/27/22   Wallis Bamberg, PA-C  cetirizine (ZYRTEC ALLERGY) 10 MG tablet Take 1 tablet (10 mg total) by mouth daily. 03/27/22   Wallis Bamberg, PA-C  guaifenesin (HUMIBID E) 400 MG TABS tablet Take 1 tablet 3 times daily as needed for chest congestion and cough 03/30/22   Theadora Rama Scales, PA-C  ibuprofen (ADVIL) 600 MG tablet Take 1 tablet (600 mg total) by mouth every 8 (eight) hours as needed for  up to 30 doses for fever, headache, mild pain or moderate pain (Inflammation). Take 1 tablet 3 times daily as needed for inflammation of upper airways and/or pain. 04/02/22   Theadora Rama Scales, PA-C  ipratropium (ATROVENT) 0.06 % nasal spray Place 2 sprays into both nostrils 3 (three) times daily. As needed for nasal congestion, runny nose 03/30/22   Theadora Rama Scales, PA-C  Liraglutide -Weight Management (SAXENDA) 18 MG/3ML SOPN Inject 1.2 mg into the skin daily.    [provider]  promethazine-dextromethorphan (PROMETHAZINE-DM) 6.25-15 MG/5ML syrup Take 2.5 mLs by mouth 3 (three) times daily as needed for cough. 03/27/22   Wallis Bamberg, PA-C  ibuprofen (ADVIL) 600 MG tablet Take 1 tablet (600 mg total) by mouth every 6 (six) hours as needed. 03/27/22   Wallis Bamberg, PA-C    Allergies Penicillins   REVIEW OF SYSTEMS  Negative except as noted here or in the History of Present Illness.   PHYSICAL EXAMINATION  Initial Vital Signs Blood pressure (!) 142/97, pulse 82, temperature 97.7 F (36.5 C), temperature source Oral, resp. rate (!) 28, height 5\' 3"  (1.6 m), weight 117 kg, SpO2 99 %.  Examination General: Well-developed, well-nourished female in no acute distress; appearance consistent with age of record HENT: normocephalic; atraumatic Eyes: Normal appearance Neck: supple Heart: regular rate and rhythm Lungs: clear to auscultation bilaterally Abdomen:  soft; nondistended; suprapubic tenderness; bowel sounds present Extremities: No deformity; full range of motion; pulses normal Neurologic: Awake, alert and oriented; motor function intact in all extremities and symmetric; no facial droop Skin: Warm and dry Psychiatric: Normal mood and affect   RESULTS  Summary of this visit's results, reviewed and interpreted by myself:   EKG Interpretation  Date/Time:    Ventricular Rate:    PR Interval:    QRS Duration:   QT Interval:    QTC Calculation:   R Axis:      Text Interpretation:         Laboratory Studies: Results for orders placed or performed during the hospital encounter of 11/23/22 (from the past 24 hour(s))  Pregnancy, urine     Status: None   Collection Time: 11/23/22  2:12 AM  Result Value Ref Range   Preg Test, Ur NEGATIVE NEGATIVE  Urinalysis, Routine w reflex microscopic -Urine, Clean Catch     Status: None   Collection Time: 11/23/22  2:37 AM  Result Value Ref Range   Color, Urine YELLOW YELLOW   APPearance CLEAR CLEAR   Specific Gravity, Urine 1.025 1.005 - 1.030   pH 5.5 5.0 - 8.0   Glucose, UA NEGATIVE NEGATIVE mg/dL   Hgb urine dipstick NEGATIVE NEGATIVE   Bilirubin Urine NEGATIVE NEGATIVE   Ketones, ur NEGATIVE NEGATIVE mg/dL   Protein, ur NEGATIVE NEGATIVE mg/dL   Nitrite NEGATIVE NEGATIVE   Leukocytes,Ua NEGATIVE NEGATIVE  CBC with Differential     Status: Abnormal   Collection Time: 11/23/22  2:37 AM  Result Value Ref Range   WBC 8.5 4.0 - 10.5 K/uL   RBC 3.56 (L) 3.87 - 5.11 MIL/uL   Hemoglobin 10.0 (L) 12.0 - 15.0 g/dL   HCT 47.8 (L) 29.5 - 62.1 %   MCV 86.2 80.0 - 100.0 fL   MCH 28.1 26.0 - 34.0 pg   MCHC 32.6 30.0 - 36.0 g/dL   RDW 30.8 65.7 - 84.6 %   Platelets 283 150 - 400 K/uL   nRBC 0.0 0.0 - 0.2 %   Neutrophils Relative % 55 %   Neutro Abs 4.8 1.7 - 7.7 K/uL   Lymphocytes Relative 30 %   Lymphs Abs 2.6 0.7 - 4.0 K/uL   Monocytes Relative 11 %   Monocytes Absolute 0.9 0.1 - 1.0 K/uL   Eosinophils Relative 3 %   Eosinophils Absolute 0.2 0.0 - 0.5 K/uL   Basophils Relative 1 %   Basophils Absolute 0.1 0.0 - 0.1 K/uL   Immature Granulocytes 0 %   Abs Immature Granulocytes 0.02 0.00 - 0.07 K/uL  Basic metabolic panel     Status: Abnormal   Collection Time: 11/23/22  2:37 AM  Result Value Ref Range   Sodium 136 135 - 145 mmol/L   Potassium 3.8 3.5 - 5.1 mmol/L   Chloride 104 98 - 111 mmol/L   CO2 24 22 - 32 mmol/L   Glucose, Bld 99 70 - 99 mg/dL   BUN 14 6 - 20 mg/dL   Creatinine, Ser  9.62 0.44 - 1.00 mg/dL   Calcium 8.3 (L) 8.9 - 10.3 mg/dL   GFR, Estimated >95 >28 mL/min   Anion gap 8 5 - 15   Imaging Studies: CT ABDOMEN PELVIS W CONTRAST  Result Date: 11/23/2022 CLINICAL DATA:  Left lower quadrant pain. EXAM: CT ABDOMEN AND PELVIS WITH CONTRAST TECHNIQUE: Multidetector CT imaging of the abdomen and pelvis was performed using the standard protocol following bolus administration  of intravenous contrast. RADIATION DOSE REDUCTION: This exam was performed according to the departmental dose-optimization program which includes automated exposure control, adjustment of the mA and/or kV according to patient size and/or use of iterative reconstruction technique. CONTRAST:  OMNIPAQUE IOHEXOL 300 MG/ML  SOLN COMPARISON:  January 13, 2018 FINDINGS: Lower chest: No acute abnormality. Hepatobiliary: No focal liver abnormality is seen. Status post cholecystectomy. No biliary dilatation. Pancreas: Unremarkable. No pancreatic ductal dilatation or surrounding inflammatory changes. Spleen: Normal in size without focal abnormality. Adrenals/Urinary Tract: Adrenal glands are unremarkable. Kidneys are normal, without renal calculi, focal lesion, or hydronephrosis. Bladder is unremarkable. Stomach/Bowel: Stomach is within normal limits. Appendix appears normal (best seen on coronal reformatted images 49 through 56, CT series 5). No evidence of bowel wall thickening, distention, or inflammatory changes. Vascular/Lymphatic: No significant vascular findings are present. No enlarged abdominal or pelvic lymph nodes. Reproductive: Uterus and bilateral adnexa are unremarkable. Other: No abdominal wall hernia or abnormality. No abdominopelvic ascites. Musculoskeletal: No acute or significant osseous findings. IMPRESSION: 1. Evidence of prior cholecystectomy. 2. No acute or active process within the abdomen or pelvis. Electronically Signed   By: Aram Candela M.D.   On: 11/23/2022 04:22    ED COURSE and MDM   Nursing notes, initial and subsequent vitals signs, including pulse oximetry, reviewed and interpreted by myself.  Vitals:   11/23/22 0204 11/23/22 0206 11/23/22 0330  BP: (!) 142/97  102/65  Pulse: 82  67  Resp: (!) 28  (!) 22  Temp: 97.7 F (36.5 C)  98 F (36.7 C)  TempSrc: Oral  Oral  SpO2: 99%  98%  Weight:  117 kg   Height:  5\' 3"  (1.6 m)    Medications  fentaNYL (SUBLIMAZE) injection 50 mcg (has no administration in time range)  hyoscyamine (LEVSIN SL) SL tablet 0.125 mg (0.125 mg Sublingual Given 11/23/22 0233)  fentaNYL (SUBLIMAZE) injection 50 mcg (50 mcg Intravenous Given 11/23/22 0232)  fentaNYL (SUBLIMAZE) injection 50 mcg (50 mcg Intravenous Given 11/23/22 0358)  iohexol (OMNIPAQUE) 300 MG/ML solution 125 mL (125 mLs Intravenous Contrast Given 11/23/22 0405)   4:12 AM Patient's presentation was initially concerning for bladder spasms but she had no relief from Levsin.  Her pain is now also felt in the left lower quadrant.  CT scan of the abdomen and pelvis is pending.   4:32 AM CT was nondiagnostic.  I am concerned the patient could be having pain of gynecologic etiology, such as an intermittent ovarian torsion.  We will obtain a pelvic ultrasound to evaluate her uterus, adnexa and Doppler flow.  7:05 AM Signed out to Dr. Lockie Mola. Pelvic U/S pending.  PROCEDURES  Procedures   ED DIAGNOSES     ICD-10-CM   1. Pelvic cramping  R10.2          Ras Kollman, MD 11/23/22 [phone]

## 2022-11-23 NOTE — ED Provider Notes (Signed)
Patient here with lower abdominal pain.  Unremarkable vitals.  Lab work and CT scan abdomen pelvis unremarkable per my review and interpretation.  Plan is to get pelvic ultrasound to further rule out pelvic etiology of the pain.  She is feeling better.  Pelvic ultrasound unremarkable.  Overall suspect bladder spasm/bowel spasm.  Recommend continued use of Tylenol and ibuprofen at home.  Discharged in good condition.  This chart was dictated using voice recognition software.  Despite best efforts to proofread,  errors can occur which can change the documentation meaning.    Virgina Norfolk, DO 11/23/22 [phone]

## 2022-11-24 ENCOUNTER — Ambulatory Visit (INDEPENDENT_AMBULATORY_CARE_PROVIDER_SITE_OTHER): Payer: Managed Care, Other (non HMO) | Admitting: Family Medicine

## 2022-11-24 ENCOUNTER — Encounter: Payer: Self-pay | Admitting: Family Medicine

## 2022-11-24 VITALS — BP 114/74 | HR 74 | Temp 98.4°F | Resp 18 | Ht 63.0 in | Wt 260.1 lb

## 2022-11-24 DIAGNOSIS — R102 Pelvic and perineal pain: Secondary | ICD-10-CM

## 2022-11-24 DIAGNOSIS — Z7689 Persons encountering health services in other specified circumstances: Secondary | ICD-10-CM

## 2022-11-24 DIAGNOSIS — Z13220 Encounter for screening for lipoid disorders: Secondary | ICD-10-CM | POA: Insufficient documentation

## 2022-11-24 DIAGNOSIS — Z0289 Encounter for other administrative examinations: Secondary | ICD-10-CM | POA: Insufficient documentation

## 2022-11-24 MED ORDER — NAPROXEN 500 MG PO TABS
500.0000 mg | ORAL_TABLET | Freq: Two times a day (BID) | ORAL | 0 refills | Status: DC
Start: 2022-11-24 — End: 2024-04-20

## 2022-11-24 NOTE — Progress Notes (Signed)
New Patient Office Visit  Subjective    Patient ID: Breanna English, female    DOB: 07-Jan-1986  Age: 37 y.o. MRN: [id number]  CC:  Chief Complaint  Patient presents with   Establish Care    Patient is here to establish care with new PCP, Patient states that she has been having bad what feels like muscle spasms in her groin area.She states that this pain started Monday night.  Patient states that for the last two years she has been having really bad cramps during her cycle.     HPI Breanna English presents to establish care with this practice. She is new to me. Reports having pain in vaginal area that radiates to left upper quadrant. Pain since late Monday.  Went to ED on 11/23/22 with severe pelvic pain. No new sexual partners. Married for 23 years. Feels like abdomen is bloated. Pain is in the vaginal area like when she was in labor. Taking tramadol with some relief, does not take it away.  Ultrasound tech assessed for lesions/fissures, etc in vaginal tissue- did not see anything. Patient has checked the area and has not seen any abnormal findings.   Reports intercourse on Thursday last week after not being active for 4 months due to her husband's injury. She reports pain during intercourse which  resolved after, current pain started 3 days after. No vaginal discharge. No itching. No antibiotic.    Chart review:  CT scan: CT scan today: no acute or active process in abdomen or pelvis. US transvaginal and torsion rule out: Right oophorectomy. There is no evidence of left ovarian torsion. There are no adnexal masses or free fluid in pelvis. There is inhomogeneous echogenicity in myometrium without focal nodules. Endometrium is unremarkable.   Transvaginal ultrasound:  IMPRESSION: Right oophorectomy. There is no evidence of left ovarian torsion. There are no adnexal masses or free fluid in pelvis. There is inhomogeneous echogenicity in myometrium without focal nodules. Endometrium is  unremarkable.  Urine negative for infection. Urine pregnancy negative.  CBC shows anemia, which is not new. BMP shows low calcium, otherwise normal.       Outpatient Encounter Medications as of 11/24/2022  Medication Sig   acetaminophen (TYLENOL) 325 MG tablet Take 2 tablets (650 mg total) by mouth every 6 (six) hours as needed for moderate pain.   cetirizine (ZYRTEC ALLERGY) 10 MG tablet Take 1 tablet (10 mg total) by mouth daily.   guaifenesin (HUMIBID E) 400 MG TABS tablet Take 1 tablet 3 times daily as needed for chest congestion and cough   hyoscyamine (LEVSIN/SL) 0.125 MG SL tablet Place 1 tablet (0.125 mg total) under the tongue every 6 (six) hours as needed.   ibuprofen (ADVIL) 600 MG tablet Take 1 tablet (600 mg total) by mouth every 8 (eight) hours as needed for up to 30 doses for fever, headache, mild pain or moderate pain (Inflammation). Take 1 tablet 3 times daily as needed for inflammation of upper airways and/or pain.   ipratropium (ATROVENT) 0.06 % nasal spray Place 2 sprays into both nostrils 3 (three) times daily. As needed for nasal congestion, runny nose   Liraglutide -Weight Management (SAXENDA) 18 MG/3ML SOPN Inject 1.2 mg into the skin daily.   naproxen (NAPROSYN) 500 MG tablet Take 1 tablet (500 mg total) by mouth 2 (two) times daily with a meal.   promethazine-dextromethorphan (PROMETHAZINE-DM) 6.25-15 MG/5ML syrup Take 2.5 mLs by mouth 3 (three) times daily as needed for cough.   traMADol (ULTRAM) 50  MG tablet Take by mouth every 6 (six) hours as needed.   [DISCONTINUED] ibuprofen (ADVIL) 600 MG tablet Take 1 tablet (600 mg total) by mouth every 6 (six) hours as needed.   No facility-administered encounter medications on file as of 11/24/2022.    Past Medical History:  Diagnosis Date   Ovarian cyst     Past Surgical History:  Procedure Laterality Date   CHOLECYSTECTOMY N/A 06/29/2017   Procedure: LAPAROSCOPIC CHOLECYSTECTOMY;  Surgeon: Kinsinger, De Blanch,  MD;  Location: [facility] OR;  Service: General;  Laterality: N/A;   LAPAROSCOPIC CHOLECYSTECTOMY  06/29/2017   OVARIAN CYST REMOVAL  2002   TUBAL LIGATION      History reviewed. No pertinent family history.  Social History   Socioeconomic History   Marital status: Married    Spouse name: Not on file   Number of children: Not on file   Years of education: Not on file   Highest education level: Not on file  Occupational History   Not on file  Tobacco Use   Smoking status: Former    Types: Cigarettes    Quit date: 06/01/2015    Years since quitting: 7.4   Smokeless tobacco: Never  Vaping Use   Vaping Use: Never used  Substance and Sexual Activity   Alcohol use: Not Currently   Drug use: No   Sexual activity: Yes    Birth control/protection: Surgical  Other Topics Concern   Not on file  Social History Narrative   Not on file   Social Determinants of Health   Financial Resource Strain: Not on file  Food Insecurity: Not on file  Transportation Needs: Not on file  Physical Activity: Not on file  Stress: Not on file  Social Connections: Not on file  Intimate Partner Violence: Not on file    Review of Systems  Constitutional:  Positive for malaise/fatigue (works night shift, 12 hour shifts). Negative for chills and fever.  Eyes:  Negative for blurred vision and double vision.  Respiratory:  Negative for shortness of breath.   Cardiovascular:  Negative for chest pain.  Gastrointestinal:  Positive for abdominal pain (radiating to right side). Negative for heartburn, nausea and vomiting.  Genitourinary:  Negative for dysuria, frequency, hematuria and urgency.       No vaginal discharge. No vaginal bleeding. LMP 6/13-6/19. Negative urine preg 11/23/22.   Skin:        No vaginal lesions or rash        Objective    BP 114/74   Pulse 74   Temp 98.4 F (36.9 C) (Oral)   Resp 18   Ht 5\' 3"  (1.6 m)   Wt 260 lb 1.6 oz (118 kg)   LMP 11/09/2022 (Approximate)   SpO2 97%   BMI  46.07 kg/m   Physical Exam Vitals and nursing note reviewed.  Constitutional:      General: She is not in acute distress.    Appearance: Normal appearance.  Cardiovascular:     Rate and Rhythm: Regular rhythm.     Heart sounds: Normal heart sounds.  Pulmonary:     Effort: Pulmonary effort is normal.     Breath sounds: Normal breath sounds.  Abdominal:     General: Bowel sounds are normal.     Palpations: Abdomen is soft.     Tenderness: There is abdominal tenderness in the left lower quadrant.  Genitourinary:    Comments: Reports vaginal pain that radiates to left lower quadrant.  Skin:  General: Skin is warm and dry.     Capillary Refill: Capillary refill takes less than 2 seconds.  Neurological:     General: No focal deficit present.     Mental Status: She is alert. Mental status is at baseline.  Psychiatric:        Mood and Affect: Mood normal.        Behavior: Behavior normal.        Thought Content: Thought content normal.        Judgment: Judgment normal.        Assessment & Plan:   Problem List Items Addressed This Visit     Establishing care with new doctor, encounter for - Primary   Vaginal pain   Relevant Medications   naproxen (NAPROSYN) 500 MG tablet   Other Relevant Orders   BV+Ct/Ng/Tv NAA+Yeast Culture   Ambulatory referral to Gynecology   Have appointment in August with GYN for routine exam, her pap is due. She will come her for CPE with labs in 4 weeks and to follow-up for vaginal pain.  Will place referral to GYN  today to see if she can be seen sooner than August for this acute problem.  Low suspicion for STD's, will check today to be thorough.  Okay to try ice and heat to vaginal area to see if this helps. She will take naproxen 500 mg with food BID.  LLQ abdominal pain with normal CT scan on 11/23/22.  Agrees with plan of care discussed.  Questions answered.   Return in about 4 weeks (around 12/22/2022) for cpe with labs .   Novella Olive, FNP

## 2022-11-27 LAB — BV+CT/NG/TV NAA+YEAST CULTURE
Chlamydia by NAA: NEGATIVE
Gonococcus by NAA: NEGATIVE
Trich vag by NAA: NEGATIVE

## 2022-11-29 ENCOUNTER — Ambulatory Visit: Payer: Managed Care, Other (non HMO) | Admitting: Family Medicine

## 2022-11-30 ENCOUNTER — Ambulatory Visit (INDEPENDENT_AMBULATORY_CARE_PROVIDER_SITE_OTHER): Payer: Managed Care, Other (non HMO) | Admitting: Family Medicine

## 2022-11-30 ENCOUNTER — Ambulatory Visit: Payer: Managed Care, Other (non HMO) | Admitting: Family Medicine

## 2022-11-30 ENCOUNTER — Encounter: Payer: Self-pay | Admitting: Family Medicine

## 2022-11-30 VITALS — BP 111/76 | HR 98 | Temp 98.6°F | Resp 18 | Ht 63.0 in | Wt 257.7 lb

## 2022-11-30 DIAGNOSIS — Z0000 Encounter for general adult medical examination without abnormal findings: Secondary | ICD-10-CM | POA: Diagnosis not present

## 2022-11-30 DIAGNOSIS — R5382 Chronic fatigue, unspecified: Secondary | ICD-10-CM | POA: Insufficient documentation

## 2022-11-30 DIAGNOSIS — Z6841 Body Mass Index (BMI) 40.0 and over, adult: Secondary | ICD-10-CM

## 2022-11-30 DIAGNOSIS — D649 Anemia, unspecified: Secondary | ICD-10-CM

## 2022-11-30 DIAGNOSIS — Z1159 Encounter for screening for other viral diseases: Secondary | ICD-10-CM

## 2022-11-30 DIAGNOSIS — Z13220 Encounter for screening for lipoid disorders: Secondary | ICD-10-CM

## 2022-11-30 NOTE — Progress Notes (Signed)
Complete physical exam  Patient: Breanna English   DOB: [birth year]   37 y.o. Female  MRN: [id number]  Subjective:    Chief Complaint  Patient presents with   Annual Exam    Patient is here to get a physical , she states that she does not a need a pap smear, she had one 7 months ago    Breanna English is a 37 y.o. female who presents today for a complete physical exam. She reports consuming a  protein and greens  diet.  crossfit  She generally feels well. 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 She does not have additional problems to discuss today.   Hypocalcemia: calcium has been low for several years, will get ionized calcium and PTH today.  Fatigue/anemia:  will get labs.  BMI 46: considering bariatric surgery. Has paperwork with her today for completion. will get labs today.   Works for [employer], has paperwork to prove she has had wellness exam today.    Most recent fall risk assessment:    11/24/2022    2:15 PM  Fall Risk   Falls in the past year? 0  Number falls in past yr: 0  Injury with Fall? 0  Risk for fall due to : No Fall Risks  Follow up Falls evaluation completed     Most recent depression screenings:    11/24/2022    2:15 PM  PHQ 2/9 Scores  PHQ - 2 Score 0  PHQ- 9 Score 0    Vision:Within last year and Dental: No current dental problems and Receives regular dental care    Patient Care Team: Novella Olive, FNP as PCP - General (Family Medicine)   Outpatient Medications Prior to Visit  Medication Sig   acetaminophen (TYLENOL) 325 MG tablet Take 2 tablets (650 mg total) by mouth every 6 (six) hours as needed for moderate pain.   cetirizine (ZYRTEC ALLERGY) 10 MG tablet Take 1 tablet (10 mg total) by mouth daily.   guaifenesin (HUMIBID E) 400 MG TABS tablet Take 1 tablet 3 times daily as needed for chest congestion and cough   hyoscyamine (LEVSIN/SL) 0.125 MG SL tablet Place 1 tablet (0.125 mg total) under the tongue every 6 (six) hours as needed.    ibuprofen (ADVIL) 600 MG tablet Take 1 tablet (600 mg total) by mouth every 8 (eight) hours as needed for up to 30 doses for fever, headache, mild pain or moderate pain (Inflammation). Take 1 tablet 3 times daily as needed for inflammation of upper airways and/or pain.   ipratropium (ATROVENT) 0.06 % nasal spray Place 2 sprays into both nostrils 3 (three) times daily. As needed for nasal congestion, runny nose   Liraglutide -Weight Management (SAXENDA) 18 MG/3ML SOPN Inject 1.2 mg into the skin daily.   naproxen (NAPROSYN) 500 MG tablet Take 1 tablet (500 mg total) by mouth 2 (two) times daily with a meal.   promethazine-dextromethorphan (PROMETHAZINE-DM) 6.25-15 MG/5ML syrup Take 2.5 mLs by mouth 3 (three) times daily as needed for cough.   traMADol (ULTRAM) 50 MG tablet Take by mouth every 6 (six) hours as needed.   No facility-administered medications prior to visit.    Review of Systems  Constitutional:  Positive for malaise/fatigue.  Eyes:  Negative for blurred vision and double vision.  Respiratory:  Negative for shortness of breath.   Cardiovascular:  Negative for chest pain.  Gastrointestinal:  Negative for nausea and vomiting.  Neurological:  Negative for dizziness and headaches.  Psychiatric/Behavioral:  Negative for depression and suicidal ideas. The patient does not have insomnia.           Objective:     BP 111/76   Pulse 98   Temp 98.6 F (37 C)   Resp 18   Ht 5\' 3"  (1.6 m)   Wt 257 lb 11.2 oz (116.9 kg)   LMP 11/09/2022 (Approximate)   SpO2 99%   BMI 45.65 kg/m  BP Readings from Last 3 Encounters:  11/30/22 111/76  11/24/22 114/74  11/23/22 117/62      Physical Exam Vitals and nursing note reviewed.  Constitutional:      General: She is not in acute distress.    Appearance: Normal appearance. She is obese.  HENT:     Right Ear: Tympanic membrane normal.     Left Ear: Tympanic membrane normal.     Nose: Nose normal.     Mouth/Throat:     Mouth:  Mucous membranes are moist.     Pharynx: Oropharynx is clear.  Eyes:     Extraocular Movements: Extraocular movements intact.  Neck:     Thyroid: No thyroid tenderness.  Cardiovascular:     Rate and Rhythm: Normal rate and regular rhythm.     Pulses: Normal pulses.     Heart sounds: Normal heart sounds.  Pulmonary:     Effort: Pulmonary effort is normal.     Breath sounds: Normal breath sounds.  Abdominal:     General: Bowel sounds are normal.     Palpations: Abdomen is soft.     Tenderness: There is no abdominal tenderness.  Musculoskeletal:     Right lower leg: No edema.     Left lower leg: No edema.  Lymphadenopathy:     Cervical:     Right cervical: No superficial cervical adenopathy.    Left cervical: No superficial cervical adenopathy.  Skin:    General: Skin is warm and dry.     Capillary Refill: Capillary refill takes less than 2 seconds.  Neurological:     General: No focal deficit present.     Mental Status: She is alert. Mental status is at baseline.  Psychiatric:        Mood and Affect: Mood normal.        Behavior: Behavior normal.        Thought Content: Thought content normal.        Judgment: Judgment normal.      No results found for any visits on 11/30/22.     Assessment & Plan:    Routine Health Maintenance and Physical Exam  Immunization History  Administered Date(s) Administered   PFIZER(Purple Top)SARS-COV-2 Vaccination 10/13/2019, 12/14/2019, 06/24/2020   Td 10/02/2021   Tdap 10/02/2021    Health Maintenance  Topic Date Due   HIV Screening  Never done   Hepatitis C Screening  Never done   COVID-19 Vaccine (4 - 2023-24 season) 01/29/2022   PAP SMEAR-Modifier  12/30/2022 (Originally 01/29/2005)   INFLUENZA VACCINE  12/30/2022   DTaP/Tdap/Td (3 - Td or Tdap) 10/03/2031   HPV VACCINES  Aged Out    Discussed health benefits of physical activity, and encouraged her to engage in regular exercise appropriate for her age and  condition.  Annual physical exam  Chronic fatigue -     B12 and Folate Panel -     Hemoglobin A1c -     TSH + free T4 -     VITAMIN D 25 Hydroxy (Vit-D Deficiency, Fractures) -  Hepatic function panel  Hypocalcemia -     Parathyroid hormone, intact (no Ca) -     Calcium, ionized  Anemia, unspecified type -     Iron, TIBC and Ferritin Panel  Encounter for lipid screening for cardiovascular disease -     Lipid panel  BMI 45.0-49.9, adult (HCC) -     Hemoglobin A1c -     TSH + free T4  Need for hepatitis C screening test -     Hepatitis C antibody  Screening for viral disease -     HIV Antibody (routine testing w rflx)    Routine labs ordered. Will return tomorrow morning when fasting for labs.  HCM reviewed/discussed. Sees GYN in August for pap smear. Anticipatory guidance regarding healthy weight, lifestyle and choices given. Recommend healthy diet.  Recommend approximately 150 minutes/week of moderate intensity exercise. Resistance training is good for building muscles and for bone health. Muscle mass helps to increase our metabolism and to burn more calories at rest.  Limit alcohol consumption: no more than one drink per day for women and 2 drinks per day for me. Recommend regular dental and vision exams. Always use seatbelt/lap and shoulder restraints. Recommend using smoke alarms and checking batteries at least twice a year. Recommend using sunscreen when outside.  Please know that I am here to help you with all of your health care goals and am happy to work with you to find a solution that works best for you.  The greatest advice I have received with any changes in life are to take it one step at a time, that even means if all you can focus on is the next 60 seconds, then do that and celebrate your victories.  With any changes in life, you will have set backs, and that is OK. The important thing to remember is, if you have a set back, it is not a failure, it is an  opportunity to try again! Agrees with plan of care discussed.  Questions answered.      Return in about 1 year (around 11/30/2023) for cpe with labs.     Novella Olive, FNP

## 2022-12-02 LAB — HIV ANTIBODY (ROUTINE TESTING W REFLEX): HIV Screen 4th Generation wRfx: NONREACTIVE

## 2022-12-04 LAB — LIPID PANEL
Chol/HDL Ratio: 3.6 ratio (ref 0.0–4.4)
Cholesterol, Total: 186 mg/dL (ref 100–199)
HDL: 52 mg/dL (ref >39)
LDL Chol Calc (NIH): 110 mg/dL — ABNORMAL HIGH (ref 0–99)
Triglycerides: 133 mg/dL (ref 0–149)
VLDL Cholesterol Cal: 24 mg/dL (ref 5–40)

## 2022-12-04 LAB — IRON,TIBC AND FERRITIN PANEL
Ferritin: 24 ng/mL (ref 15–150)
Iron Saturation: 19 % (ref 15–55)
Iron: 71 ug/dL (ref 27–159)
Total Iron Binding Capacity: 370 ug/dL (ref 250–450)
UIBC: 299 ug/dL (ref 131–425)

## 2022-12-04 LAB — HEPATIC FUNCTION PANEL
ALT: 11 IU/L (ref 0–32)
AST: 17 IU/L (ref 0–40)
Albumin: 4.1 g/dL (ref 3.9–4.9)
Alkaline Phosphatase: 79 IU/L (ref 44–121)
Bilirubin Total: <0.2 mg/dL (ref 0.0–1.2)
Bilirubin, Direct: <0.1 mg/dL (ref 0.00–0.40)
Total Protein: 7.1 g/dL (ref 6.0–8.5)

## 2022-12-04 LAB — HEMOGLOBIN A1C
Est. average glucose Bld gHb Est-mCnc: 111 mg/dL
Hgb A1c MFr Bld: 5.5 % (ref 4.8–5.6)

## 2022-12-04 LAB — CALCIUM, IONIZED: Calcium, Ion: 4.9 mg/dL (ref 4.5–5.6)

## 2022-12-04 LAB — TSH+FREE T4
Free T4: 1.01 ng/dL (ref 0.82–1.77)
TSH: 8.48 u[IU]/mL — ABNORMAL HIGH (ref 0.450–4.500)

## 2022-12-04 LAB — HEPATITIS C ANTIBODY: Hep C Virus Ab: NONREACTIVE

## 2022-12-04 LAB — PARATHYROID HORMONE, INTACT (NO CA): PTH: 29 pg/mL (ref 15–65)

## 2022-12-04 LAB — B12 AND FOLATE PANEL
Folate: 7.5 ng/mL (ref >3.0)
Vitamin B-12: 600 pg/mL (ref 232–1245)

## 2022-12-04 LAB — VITAMIN D 25 HYDROXY (VIT D DEFICIENCY, FRACTURES): Vit D, 25-Hydroxy: 14.7 ng/mL — ABNORMAL LOW (ref 30.0–100.0)

## 2022-12-06 ENCOUNTER — Encounter: Payer: Self-pay | Admitting: Family Medicine

## 2022-12-06 ENCOUNTER — Other Ambulatory Visit: Payer: Self-pay | Admitting: Family Medicine

## 2022-12-06 DIAGNOSIS — E559 Vitamin D deficiency, unspecified: Secondary | ICD-10-CM | POA: Insufficient documentation

## 2022-12-06 DIAGNOSIS — E039 Hypothyroidism, unspecified: Secondary | ICD-10-CM

## 2022-12-06 MED ORDER — VITAMIN D (ERGOCALCIFEROL) 1.25 MG (50000 UNIT) PO CAPS
50000.0000 [IU] | ORAL_CAPSULE | ORAL | 0 refills | Status: DC
Start: 2022-12-06 — End: 2024-04-20

## 2022-12-06 MED ORDER — LEVOTHYROXINE SODIUM 100 MCG PO TABS
100.0000 ug | ORAL_TABLET | Freq: Every day | ORAL | 0 refills | Status: DC
Start: 2022-12-06 — End: 2023-01-26

## 2022-12-10 ENCOUNTER — Encounter: Payer: Self-pay | Admitting: Family Medicine

## 2023-01-07 ENCOUNTER — Other Ambulatory Visit: Payer: Self-pay | Admitting: Family Medicine

## 2023-01-07 DIAGNOSIS — Z6841 Body Mass Index (BMI) 40.0 and over, adult: Secondary | ICD-10-CM

## 2023-01-25 ENCOUNTER — Other Ambulatory Visit: Payer: Self-pay | Admitting: Family Medicine

## 2023-01-25 ENCOUNTER — Encounter: Payer: Managed Care, Other (non HMO) | Admitting: Nurse Practitioner

## 2023-01-25 DIAGNOSIS — E039 Hypothyroidism, unspecified: Secondary | ICD-10-CM

## 2023-01-26 ENCOUNTER — Other Ambulatory Visit: Payer: Self-pay | Admitting: Family Medicine

## 2023-01-26 DIAGNOSIS — E039 Hypothyroidism, unspecified: Secondary | ICD-10-CM

## 2023-01-26 LAB — TSH+FREE T4
Free T4: 1.34 ng/dL (ref 0.82–1.77)
TSH: 0.499 u[IU]/mL (ref 0.450–4.500)

## 2023-01-26 MED ORDER — LEVOTHYROXINE SODIUM 100 MCG PO TABS
100.0000 ug | ORAL_TABLET | Freq: Every day | ORAL | 0 refills | Status: DC
Start: 2023-01-26 — End: 2024-04-20

## 2023-02-23 ENCOUNTER — Encounter: Payer: Managed Care, Other (non HMO) | Admitting: Family Medicine

## 2023-03-04 ENCOUNTER — Other Ambulatory Visit: Payer: Self-pay | Admitting: Family Medicine

## 2023-03-04 DIAGNOSIS — E559 Vitamin D deficiency, unspecified: Secondary | ICD-10-CM

## 2023-03-04 NOTE — Telephone Encounter (Signed)
Requesting rx rf of Vitamin D Last written 12/06/2022 Last OV 11/30/2022 No upcoming appt schld.

## 2023-03-07 NOTE — Telephone Encounter (Signed)
Attempted call to patient. Left a voice mail message requesting a return call.  

## 2023-05-18 ENCOUNTER — Ambulatory Visit: Payer: Managed Care, Other (non HMO) | Admitting: Family Medicine

## 2024-04-19 ENCOUNTER — Telehealth: Payer: Self-pay

## 2024-04-19 NOTE — Telephone Encounter (Unsigned)
 Copied from CRM [id number]. Topic: General - Other >> Apr 19, 2024  1:59 PM Rosaria BRAVO wrote: Reason for CRM: Pt called requesting to have this request for paperwork completion expedited or as soon as possible. Made aware of the 3-5 day turn around time.

## 2024-04-19 NOTE — Telephone Encounter (Signed)
 Patient dropped paperwork for you to fill out, (Bariatric PCP Surgery Support Letter) Paperwork is placed in work box.

## 2024-04-20 ENCOUNTER — Ambulatory Visit (INDEPENDENT_AMBULATORY_CARE_PROVIDER_SITE_OTHER): Admitting: Family Medicine

## 2024-04-20 ENCOUNTER — Encounter: Payer: Self-pay | Admitting: Family Medicine

## 2024-04-20 VITALS — BP 129/85 | HR 80 | Temp 98.0°F | Ht 63.0 in | Wt 274.0 lb

## 2024-04-20 DIAGNOSIS — Z0289 Encounter for other administrative examinations: Secondary | ICD-10-CM

## 2024-04-20 DIAGNOSIS — Z6841 Body Mass Index (BMI) 40.0 and over, adult: Secondary | ICD-10-CM | POA: Insufficient documentation

## 2024-04-20 DIAGNOSIS — E66813 Obesity, class 3: Secondary | ICD-10-CM | POA: Diagnosis not present

## 2024-04-20 NOTE — Assessment & Plan Note (Signed)
 Bariatric surgery scheduled for December.

## 2024-04-20 NOTE — Progress Notes (Signed)
   Established Patient Office Visit  Subjective   Patient ID: Jonique Kulig, female    DOB: [birth year]  Age: 38 y.o. MRN: [id number]  Chief Complaint  Patient presents with   Paperwork    Bariatric surgery scheduled in December. Paperwork completed for PCP support letter.  Scheduled for CPE soon.        ROS    Objective:     BP 129/85 (BP Location: Left Arm, Patient Position: Sitting, Cuff Size: Large)   Pulse 80   Temp 98 F (36.7 C) (Oral)   Ht 5' 3 (1.6 m)   Wt 274 lb (124.3 kg)   LMP 04/03/2024 (Exact Date)   SpO2 99%   BMI 48.54 kg/m    Physical Exam Vitals and nursing note reviewed.  Constitutional:      Appearance: Normal appearance. She is obese.  Pulmonary:     Effort: Pulmonary effort is normal.  Skin:    General: Skin is warm and dry.  Neurological:     General: No focal deficit present.     Mental Status: Mental status is at baseline.  Psychiatric:        Mood and Affect: Mood normal.        Behavior: Behavior normal.        Thought Content: Thought content normal.        Judgment: Judgment normal.      No results found for any visits on 04/20/24.    The 10-year ASCVD risk score (Arnett DK, et al., 2019) is: 0.6%    Assessment & Plan:   Problem List Items Addressed This Visit     Encounter for completion of form with patient   Class 3 severe obesity without serious comorbidity with body mass index (BMI) of 45.0 to 49.9 in adult [facility]) - Primary   Bariatric surgery scheduled for December.      Agrees with plan of care discussed.  Questions answered.   Return for make next appoitment for CPE with pap (40 minutes) .    Darice JONELLE Brownie, FNP

## 2024-04-23 ENCOUNTER — Encounter: Admitting: Family Medicine
